# Patient Record
Sex: Male | Born: 2000 | Race: Black or African American | Hispanic: No | Marital: Single | Smoking: Never smoker
Health system: Southern US, Community
[De-identification: ages and names within clinical notes are randomized; demographics above are authoritative.]

---
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
## 2018-03-25 ENCOUNTER — Ambulatory Visit: Payer: Self-pay | Admitting: Nutrition

## 2018-06-27 ENCOUNTER — Ambulatory Visit (INDEPENDENT_AMBULATORY_CARE_PROVIDER_SITE_OTHER): Payer: Medicaid Other | Admitting: Family Medicine

## 2018-06-27 ENCOUNTER — Encounter: Payer: Self-pay | Admitting: Family Medicine

## 2018-06-27 VITALS — BP 127/79 | HR 59 | Temp 97.6°F | Ht 74.0 in | Wt 197.1 lb

## 2018-06-27 DIAGNOSIS — Z0000 Encounter for general adult medical examination without abnormal findings: Secondary | ICD-10-CM

## 2018-06-27 DIAGNOSIS — Z00129 Encounter for routine child health examination without abnormal findings: Secondary | ICD-10-CM

## 2018-06-27 NOTE — Progress Notes (Signed)
Subjective:  Patient ID: Harold Rasmussen, male    DOB: 09/29/2001  Age: 17 y.o. MRN: [id number]  CC: Annual Exam   HPI Harold Rasmussen presents for annual physical. Attends [employer]. Considering [employer] Hasn't decided which branch.  Depression screen [facility] 2/9 06/27/2018 06/04/2017 05/21/2017  Decreased Interest 0 0 0  Down, Depressed, Hopeless 0 0 0  PHQ - 2 Score 0 0 0  Altered sleeping - - -  Tired, decreased energy - - -  Change in appetite - - -  Feeling bad or failure about yourself  - - -  Trouble concentrating - - -  Moving slowly or fidgety/restless - - -  Suicidal thoughts - - -  PHQ-9 Score - - -    History Harold Rasmussen has no past medical history on file.   He has no past surgical history on file.   His family history is not on file.He reports that he has never smoked. He has never used smokeless tobacco. He reports that he does not drink alcohol. His drug history is not on file.    ROS Review of Systems  Constitutional: Negative for activity change, fatigue and unexpected weight change.  HENT: Negative for congestion, ear pain, hearing loss, postnasal drip and trouble swallowing.   Eyes: Negative for pain and visual disturbance.  Respiratory: Negative for cough, chest tightness and shortness of breath.   Cardiovascular: Negative for chest pain, palpitations and leg swelling.  Gastrointestinal: Negative for abdominal distention, abdominal pain, blood in stool, constipation, diarrhea, nausea and vomiting.  Endocrine: Negative for cold intolerance, heat intolerance and polydipsia.  Genitourinary: Negative for difficulty urinating, dysuria, flank pain, frequency and urgency.  Musculoskeletal: Negative for arthralgias and joint swelling.  Skin: Negative for color change, rash and wound.  Neurological: Negative for dizziness, syncope, speech difficulty, weakness, light-headedness, numbness and headaches.  Hematological: Does not bruise/bleed easily.    Psychiatric/Behavioral: Negative for confusion, decreased concentration, dysphoric mood and sleep disturbance. The patient is not nervous/anxious.     Objective:  BP 127/79   Pulse (!) 59   Temp 97.6 F (36.4 C) (Oral)   Ht 6' 2"  (1.88 m)   Wt 197 lb 2 oz (89.4 kg)   BMI 25.31 kg/m   BP Readings from Last 3 Encounters:  06/27/18 127/79  06/04/17 118/76  05/21/17 120/72    Wt Readings from Last 3 Encounters:  06/27/18 197 lb 2 oz (89.4 kg) (92 %, Z= 1.40)*  06/04/17 180 lb 12.8 oz (82 kg) (86 %, Z= 1.10)*  05/21/17 184 lb (83.5 kg) (88 %, Z= 1.19)*   * Growth percentiles are based on CDC (Boys, 2-20 Years) data.     Physical Exam  Constitutional: He is oriented to person, place, and time. He appears well-developed and well-nourished.  HENT:  Head: Normocephalic and atraumatic.  Mouth/Throat: Oropharynx is clear and moist.  Eyes: Pupils are equal, round, and reactive to light. EOM are normal.  Neck: Normal range of motion. No tracheal deviation present. No thyromegaly present.  Cardiovascular: Normal rate, regular rhythm and normal heart sounds. Exam reveals no gallop and no friction rub.  No murmur heard. Pulmonary/Chest: Breath sounds normal. He has no wheezes. He has no rales.  Abdominal: Soft. Bowel sounds are normal. He exhibits no distension and no mass. There is no tenderness. Hernia confirmed negative in the right inguinal area and confirmed negative in the left inguinal area.  Genitourinary: Testes normal and penis normal.  Musculoskeletal: Normal range of motion. He exhibits no  edema.  Lymphadenopathy:    He has no cervical adenopathy.  Neurological: He is alert and oriented to person, place, and time.  Skin: Skin is warm and dry.  Psychiatric: He has a normal mood and affect.      Assessment & Plan:   Harold Rasmussen was seen today for annual exam.  Diagnoses and all orders for this visit:  Routine general medical examination at a health care facility -      CBC with Differential/Platelet -     CMP14+EGFR -     Lipid panel       I have discontinued Harold Rasmussen's methylphenidate. I am also having him maintain his acetaminophen and cetirizine.  Allergies as of 06/27/2018   No Known Allergies     Medication List        Accurate as of 06/27/18  3:01 PM. Always use your most recent med list.          acetaminophen 325 MG tablet Commonly known as:  TYLENOL Take 650 mg by mouth every 6 (six) hours as needed (headache).   cetirizine 10 MG tablet Commonly known as:  ZYRTEC TAKE 1 TABLET EVERY DAY        Follow-up: Return in about 1 year (around 06/28/2019).  Claretta Fraise, M.D.

## 2018-06-28 LAB — CMP14+EGFR
ALT: 14 IU/L (ref 0–44)
AST: 22 IU/L (ref 0–40)
Albumin/Globulin Ratio: 1.6 (ref 1.2–2.2)
Albumin: 4.4 g/dL (ref 3.5–5.5)
Alkaline Phosphatase: 137 IU/L — ABNORMAL HIGH (ref 39–117)
BUN/Creatinine Ratio: 8 — ABNORMAL LOW (ref 9–20)
BUN: 8 mg/dL (ref 6–20)
Bilirubin Total: 0.6 mg/dL (ref 0.0–1.2)
CO2: 26 mmol/L (ref 20–29)
Calcium: 10 mg/dL (ref 8.7–10.2)
Chloride: 99 mmol/L (ref 96–106)
Creatinine, Ser: 1.06 mg/dL (ref 0.76–1.27)
GFR calc Af Amer: 117 mL/min/{1.73_m2} (ref >59)
GFR calc non Af Amer: 101 mL/min/{1.73_m2} (ref >59)
Globulin, Total: 2.8 g/dL (ref 1.5–4.5)
Glucose: 71 mg/dL (ref 65–99)
Potassium: 3.9 mmol/L (ref 3.5–5.2)
Sodium: 140 mmol/L (ref 134–144)
Total Protein: 7.2 g/dL (ref 6.0–8.5)

## 2018-06-28 LAB — CBC WITH DIFFERENTIAL/PLATELET
Basophils Absolute: 0 10*3/uL (ref 0.0–0.2)
Basos: 1 %
EOS (ABSOLUTE): 0.4 10*3/uL (ref 0.0–0.4)
Eos: 8 %
Hematocrit: 40.4 % (ref 37.5–51.0)
Hemoglobin: 14 g/dL (ref 13.0–17.7)
Immature Grans (Abs): 0 10*3/uL (ref 0.0–0.1)
Immature Granulocytes: 0 %
Lymphocytes Absolute: 1.6 10*3/uL (ref 0.7–3.1)
Lymphs: 31 %
MCH: 30.6 pg (ref 26.6–33.0)
MCHC: 34.7 g/dL (ref 31.5–35.7)
MCV: 88 fL (ref 79–97)
Monocytes Absolute: 0.5 10*3/uL (ref 0.1–0.9)
Monocytes: 11 %
Neutrophils Absolute: 2.4 10*3/uL (ref 1.4–7.0)
Neutrophils: 49 %
Platelets: 216 10*3/uL (ref 150–450)
RBC: 4.57 x10E6/uL (ref 4.14–5.80)
RDW: 13.6 % (ref 12.3–15.4)
WBC: 4.9 10*3/uL (ref 3.4–10.8)

## 2018-06-28 LAB — LIPID PANEL
Chol/HDL Ratio: 3.4 ratio (ref 0.0–5.0)
Cholesterol, Total: 175 mg/dL — ABNORMAL HIGH (ref 100–169)
HDL: 51 mg/dL (ref >39)
LDL Calculated: 99 mg/dL (ref 0–109)
Triglycerides: 126 mg/dL — ABNORMAL HIGH (ref 0–89)
VLDL Cholesterol Cal: 25 mg/dL (ref 5–40)

## 2018-08-05 ENCOUNTER — Other Ambulatory Visit: Payer: Self-pay | Admitting: Family Medicine

## 2018-11-07 ENCOUNTER — Encounter: Payer: Self-pay | Admitting: Family Medicine

## 2018-11-07 ENCOUNTER — Ambulatory Visit (INDEPENDENT_AMBULATORY_CARE_PROVIDER_SITE_OTHER): Payer: Medicaid Other | Admitting: Family Medicine

## 2018-11-07 VITALS — BP 133/87 | HR 73 | Temp 97.4°F | Ht 74.0 in | Wt 188.5 lb

## 2018-11-07 DIAGNOSIS — L03012 Cellulitis of left finger: Secondary | ICD-10-CM

## 2018-11-07 MED ORDER — CIPROFLOXACIN HCL 500 MG PO TABS: 500.0000 mg | ORAL_TABLET | Freq: Two times a day (BID) | ORAL | 0 refills | Status: DC

## 2018-11-07 NOTE — Progress Notes (Signed)
Chief Complaint  Patient presents with  . Hand Pain    pt here today c/o left index finger swollen and tender     HPI  Patient presents today for sweling andpain for 2 days. Getting worse. Located at left index finger adjacent to the nail. No fever chills or sweats.  PMH: Smoking status noted ROS: Per HPI  Objective: BP 133/87   Pulse 73   Temp (!) 97.4 F (36.3 C) (Oral)   Ht 6\' 2"  (1.88 m)   Wt 188 lb 8 oz (85.5 kg)   BMI 24.20 kg/m  Gen: NAD, alert, cooperative with exam HEENT: NCAT, EOMI, PERRL  Ext: No edema, warm. FROM left hand and index finger. Lesion is moderately edematous & erythematous with fluctuance. Sterile prep with betadine. Lanced with sterile blade exuding 1 ml serosanguinous matterdressing applied. Neuro: Alert and oriented, No gross deficits  Assessment and plan:  1. Paronychia of finger of left hand     Meds ordered this encounter  Medications  . ciprofloxacin (CIPRO) 500 MG tablet    Sig: Take 1 tablet (500 mg total) by mouth 2 (two) times daily.    Dispense:  20 tablet    Refill:  0    Wound care reviewed.   Follow up as needed.  Mechele Claude, MD

## 2019-09-18 ENCOUNTER — Encounter: Payer: Self-pay | Admitting: *Deleted

## 2019-09-18 ENCOUNTER — Encounter: Payer: Self-pay | Admitting: Family

## 2019-09-18 ENCOUNTER — Other Ambulatory Visit: Payer: Self-pay

## 2019-09-18 ENCOUNTER — Ambulatory Visit (INDEPENDENT_AMBULATORY_CARE_PROVIDER_SITE_OTHER): Payer: PRIVATE HEALTH INSURANCE | Admitting: Family

## 2019-09-18 VITALS — BP 135/78 | HR 57 | Temp 98.6°F | Ht 74.0 in | Wt 184.0 lb

## 2019-09-18 DIAGNOSIS — S61212A Laceration without foreign body of right middle finger without damage to nail, initial encounter: Secondary | ICD-10-CM

## 2019-09-18 DIAGNOSIS — S9031XA Contusion of right foot, initial encounter: Secondary | ICD-10-CM | POA: Diagnosis not present

## 2019-09-18 NOTE — Progress Notes (Signed)
Subjective:    Patient ID: Harold Rasmussen, male    DOB: 14-Jun-2001, 18 y.o.   MRN: [id number]  Chief Complaint  Patient presents with  . pain in heel of right foot  . right, middle finger laceration   PT presents to the office today with a cut of his right middle finger. He states he dropped his phone in a friends couch and cut his finger on a piece of metal while trying to get it.  Laceration  The incident occurred 12 to 24 hours ago. Pain location: right middle finger. Size: 0.8 cm. The laceration mechanism was a metal edge. The pain is at a severity of 4/10. The pain is mild. The pain has been intermittent since onset. He reports no foreign bodies present. His tetanus status is UTD.  Foot Injury  The incident occurred more than 1 week ago. The injury mechanism was a direct blow. Pain location: right heel. The pain is at a severity of 2/10 (when running, 0 while sitting). The pain has been intermittent since onset. Pertinent negatives include no loss of motion. He reports no foreign bodies present. Exacerbated by: running. He has tried acetaminophen for the symptoms. The treatment provided mild relief.      Review of Systems  All other systems reviewed and are negative.      Objective:   Physical Exam Vitals reviewed.  Constitutional:      General: He is not in acute distress.    Appearance: He is well-developed.  HENT:     Head: Normocephalic.     Right Ear: External ear normal.     Left Ear: External ear normal.  Eyes:     General:        Right eye: No discharge.        Left eye: No discharge.     Pupils: Pupils are equal, round, and reactive to light.  Neck:     Thyroid: No thyromegaly.  Cardiovascular:     Rate and Rhythm: Normal rate and regular rhythm.     Heart sounds: Normal heart sounds. No murmur.  Pulmonary:     Effort: Pulmonary effort is normal. No respiratory distress.     Breath sounds: Normal breath sounds. No wheezing.  Abdominal:     General:  Bowel sounds are normal. There is no distension.     Palpations: Abdomen is soft.     Tenderness: There is no abdominal tenderness.  Musculoskeletal:        General: No tenderness. Normal range of motion.     Cervical back: Normal range of motion and neck supple.  Skin:    General: Skin is warm and dry.     Findings: No erythema or rash.     Comments: Small incision 0.8 cm on right middle finger under second joint. Full ROM of hand  Neurological:     Mental Status: He is alert and oriented to person, place, and time.     Cranial Nerves: No cranial nerve deficit.     Deep Tendon Reflexes: Reflexes are normal and symmetric.  Psychiatric:        Behavior: Behavior normal.        Thought Content: Thought content normal.        Judgment: Judgment normal.    Dermabond applied to right middle finger. Bandage applied. No erythemas, swelling, or discharge   BP 135/78   Pulse (!) 57   Temp 98.6 F (37 C) (Temporal)   Ht 6\' 2"  (  1.88 m)   Wt 184 lb (83.5 kg)   SpO2 100%   BMI 23.62 kg/m       Assessment & Plan:  Norton Bivins comes in today with chief complaint of pain in heel of right foot and right, middle finger laceration   Diagnosis and orders addressed:  1. Laceration of right middle finger without foreign body without damage to nail, initial encounter Keep area clean, avoid getting wet for next 2 days TDAP UTD Report any s/s of infection   2. Contusion of right heel, initial encounter Rest Ice  ROM exercises encouraged      Evelina Dun, FNP

## 2019-09-18 NOTE — Patient Instructions (Signed)
Laceration Care, Adult  A laceration is a cut that may go through all layers of the skin and into the tissue that is right under the skin. Some lacerations heal on their own. Others need to be closed with stitches (sutures), staples, skin adhesive strips, or skin glue. Proper care of a laceration reduces the risk for infection, helps the laceration heal better, and may prevent scarring.  How to care for your laceration  Wash your hands with soap and water before touching your wound or changing your bandage (dressing). If soap and water are not available, use hand sanitizer.  Keep the wound clean and dry.  If you were given a dressing, you should change it at least once a day, or as told by your health care provider. You should also change it if it becomes wet or dirty.  If sutures or staples were used:  · Keep the wound completely dry for the first 24 hours, or as told by your health care provider. After that time, you may shower or bathe. However, make sure that the wound is not soaked in water until after the sutures or staples have been removed.  · Clean the wound once each day, or as told by your health care provider:  ? Wash the wound with soap and water.  ? Rinse the wound with water to remove all soap.  ? Pat the wound dry with a clean towel. Do not rub the wound.  · After cleaning the wound, apply a thin layer of antibiotic ointment as told by your health care provider. This will help prevent infection and keep the dressing from sticking to the wound.  · Have the sutures or staples removed as told by your health care provider.  If skin adhesive strips were used:  · Do not get the skin adhesive strips wet. You may shower or bathe, but be careful to keep the wound dry.  · If the wound gets wet, pat it dry with a clean towel. Do not rub the wound.  · Skin adhesive strips fall off on their own. You may trim the strips as the wound heals. Do not remove skin adhesive strips that are still stuck to the wound. They  will fall off in time.  If skin glue was used:  · Try to keep the wound dry, but you may briefly wet it in the shower or bath. Do not soak the wound in water, such as by swimming.  · After you have showered or bathed, gently pat the wound dry with a clean towel. Do not rub the wound.  · Do not do any activities that will make you sweat heavily until the skin glue has fallen off on its own.  · Do not apply liquid, cream, or ointment medicine to the wound while the skin glue is in place. Using those may loosen the film before the wound has healed.  · If a dressing is placed over the wound, be careful not to apply tape directly over the skin glue. Doing that may cause the glue to be pulled off before the wound has healed.  · Do not pick at the glue. Skin glue usually remains in place for 5-10 days and then falls off the skin.  General instructions    · Take over-the-counter and prescription medicines only as told by your health care provider.  · If you were prescribed an antibiotic medicine or ointment, take or apply it as told by your health care provider.   infection. Watch for: ? Redness, swelling, or pain. ? Fluid, blood, or pus.  Raise (elevate) the injured area above the level of your heart while you are sitting or lying down for the first 24-48 hours after the laceration is repaired.  If directed, put ice on the affected area: ? Put ice in a plastic bag. ? Place a towel between your skin and the bag. ? Leave the ice on for 20 minutes, 2-3 times a day.  Keep all follow-up visits as told by your health care provider. This is important. Contact a health care provider if:  You received a tetanus shot and you have swelling, severe pain, redness, or bleeding at the injection site.  You have a fever.  A wound that was closed breaks open.  You notice a bad smell  coming from your wound or your dressing.  You notice something coming out of the wound, such as wood or glass.  Your pain is not controlled with medicine.  You have increased redness, swelling, or pain at the site of your wound.  You have fluid, blood, or pus coming from your wound.  You need to change the dressing often due to fluid, blood, or pus that is draining from the wound.  You develop a new rash.  You develop numbness around the wound. Get help right away if:  You develop severe swelling around the wound.  Your pain suddenly increases and is severe.  You develop painful lumps near the wound or on skin anywhere else on your body.  You have a red streak going away from your wound.  The wound is on your hand or foot and you cannot properly move a finger or toe.  The wound is on your hand or foot, and you notice that your fingers or toes look pale or bluish. Summary  A laceration is a cut that may go through all layers of the skin and into the tissue that is right under the skin.  Some lacerations heal on their own. Others need to be closed with stitches (sutures), staples, skin adhesive strips, or skin glue.  Proper care of a laceration reduces the risk of infection, helps the laceration heal better, and prevents scarring. This information is not intended to replace advice given to you by your health care provider. Make sure you discuss any questions you have with your health care provider. Document Released: 09/24/2005 Document Revised: 11/22/2017 Document Reviewed: 10/14/2017 Elsevier Patient Education  2020 [employer] Contusion A contusion is a deep bruise. This is a result of an injury that causes bleeding under the skin. Symptoms of bruising include pain, swelling, and discolored skin. The skin may turn blue, purple, or yellow. Follow these instructions at home: Managing pain, stiffness, and swelling You may use RICE. This stands  for:  Resting.  Icing.  Compression, or putting pressure.  Elevating, or raising the injured area. To follow this method, do these actions:  Rest the injured area.  If told, put ice on the injured area. ? Put ice in a plastic bag. ? Place a towel between your skin and the bag. ? Leave the ice on for 20 minutes, 2-3 times per day.  If told, put light pressure (compression) on the injured area using an elastic bandage. Make sure the bandage is not too tight. If the area tingles or becomes numb, remove it and put it back on as told by your doctor.  If possible, raise (elevate) the injured area above the level  of your heart while you are sitting or lying down.  General instructions  Take over-the-counter and prescription medicines only as told by your doctor.  Keep all follow-up visits as told by your doctor. This is important. Contact a doctor if:  Your symptoms do not get better after several days of treatment.  Your symptoms get worse.  You have trouble moving the injured area. Get help right away if:  You have very bad pain.  You have a loss of feeling (numbness) in a hand or foot.  Your hand or foot turns pale or cold. Summary  A contusion is a deep bruise. This is a result of an injury that causes bleeding under the skin.  Symptoms of bruising include pain, swelling, and discolored skin. The skin may turn blue, purple, or yellow.  This condition is treated with rest, ice, compression, and elevation. This is also called RICE. You may be given over-the-counter medicines for pain.  Contact a doctor if you do not feel better, or you feel worse. Get help right away if you have very bad pain, have lost feeling in a hand or foot, or the area turns pale or cold. This information is not intended to replace advice given to you by your health care provider. Make sure you discuss any questions you have with your health care provider. Document Released: 03/12/2008 Document  Revised: 05/16/2018 Document Reviewed: 05/16/2018 Elsevier Patient Education  2020 [employer].

## 2019-11-30 ENCOUNTER — Telehealth: Payer: Self-pay | Admitting: Family Medicine

## 2019-11-30 ENCOUNTER — Encounter: Payer: Self-pay | Admitting: Family Medicine

## 2019-11-30 ENCOUNTER — Ambulatory Visit (INDEPENDENT_AMBULATORY_CARE_PROVIDER_SITE_OTHER): Payer: 59

## 2019-11-30 ENCOUNTER — Ambulatory Visit (INDEPENDENT_AMBULATORY_CARE_PROVIDER_SITE_OTHER): Payer: 59 | Admitting: Family Medicine

## 2019-11-30 ENCOUNTER — Other Ambulatory Visit: Payer: Self-pay

## 2019-11-30 VITALS — BP 137/89 | HR 63 | Temp 99.5°F | Ht 74.0 in | Wt 185.8 lb

## 2019-11-30 DIAGNOSIS — S4991XA Unspecified injury of right shoulder and upper arm, initial encounter: Secondary | ICD-10-CM | POA: Diagnosis not present

## 2019-11-30 DIAGNOSIS — Z202 Contact with and (suspected) exposure to infections with a predominantly sexual mode of transmission: Secondary | ICD-10-CM | POA: Diagnosis not present

## 2019-11-30 DIAGNOSIS — S43421A Sprain of right rotator cuff capsule, initial encounter: Secondary | ICD-10-CM | POA: Diagnosis not present

## 2019-11-30 MED ORDER — DICLOFENAC SODIUM 75 MG PO TBEC: 75.0000 mg | DELAYED_RELEASE_TABLET | Freq: Two times a day (BID) | ORAL | 1 refills | Status: DC

## 2019-11-30 MED ORDER — CEFTRIAXONE SODIUM 500 MG IJ SOLR
500.0000 mg | Freq: Once | INTRAMUSCULAR | Status: AC
  Administered 2019-11-30: 15:00:00 500 mg via INTRAMUSCULAR

## 2019-11-30 MED ORDER — DOXYCYCLINE HYCLATE 100 MG PO CAPS: 100.0000 mg | ORAL_CAPSULE | Freq: Two times a day (BID) | ORAL | 0 refills | Status: DC

## 2019-11-30 NOTE — Telephone Encounter (Signed)
Patient was seen today by Dr. Darlyn Read- please advise.

## 2019-11-30 NOTE — Progress Notes (Signed)
Subjective:  Patient ID: Harold Rasmussen, male    DOB: 01/22/2001  Age: 19 y.o. MRN: [id number]  CC: Shoulder Pain (Right, Injury 11/28/19)   HPI Royston Burdi presents for pain at right shoulder after boxing injury 2 days ago. Threw a punch and missed, causing his right shoulder to rotate inward and extend forcefully.  Denies STD sx. Girlfriend tested positive for chlamydia. They last had intercourse 3 weeks ago.   Depression screen [facility] 2/9 11/30/2019 09/18/2019 06/27/2018  Decreased Interest 0 0 0  Down, Depressed, Hopeless 0 0 0  PHQ - 2 Score 0 0 0  Altered sleeping - - -  Tired, decreased energy - - -  Change in appetite - - -  Feeling bad or failure about yourself  - - -  Trouble concentrating - - -  Moving slowly or fidgety/restless - - -  Suicidal thoughts - - -  PHQ-9 Score - - -    History [place] has no past medical history on file.   He has no past surgical history on file.   His family history is not on file.He reports that he has never smoked. He has never used smokeless tobacco. He reports that he does not drink alcohol or use drugs.    ROS Review of Systems Noncontributory   Objective:  BP 137/89   Pulse 63   Temp 99.5 F (37.5 C) (Temporal)   Ht 6\' 2"  (1.88 m)   Wt 185 lb 12.8 oz (84.3 kg)   BMI 23.86 kg/m   BP Readings from Last 3 Encounters:  11/30/19 137/89  09/18/19 135/78  11/07/18 133/87    Wt Readings from Last 3 Encounters:  11/30/19 185 lb 12.8 oz (84.3 kg)  09/18/19 184 lb (83.5 kg)  11/07/18 188 lb 8 oz (85.5 kg) (87 %, Z= 1.14)*   * Growth percentiles are based on CDC (Boys, 2-20 Years) data.     Physical Exam Vitals reviewed.  Constitutional:      Appearance: He is well-developed.  HENT:     Head: Normocephalic and atraumatic.     Right Ear: External ear normal.     Left Ear: External ear normal.     Mouth/Throat:     Pharynx: No oropharyngeal exudate or posterior oropharyngeal erythema.  Eyes:   Pupils: Pupils are equal, round, and reactive to light.  Cardiovascular:     Rate and Rhythm: Normal rate and regular rhythm.     Heart sounds: No murmur.  Pulmonary:     Effort: No respiratory distress.     Breath sounds: Normal breath sounds.  Musculoskeletal:        General: Tenderness (right shoulder over deltoid) present.     Cervical back: Normal range of motion and neck supple.  Neurological:     Mental Status: He is alert and oriented to person, place, and time.       Assessment & Plan:   Jathniel was seen today for shoulder pain.  Diagnoses and all orders for this visit:  Injury of right shoulder, initial encounter -     DG Shoulder Right; Future  Sprain of right rotator cuff capsule, initial encounter -     diclofenac (VOLTAREN) 75 MG EC tablet; Take 1 tablet (75 mg total) by mouth 2 (two) times daily. For muscle and  Joint pain  Chlamydia contact -     C. trachomatis/N. gonorrhoeae RNA -     HIV Antibody (routine testing w rflx) -  RPR -     doxycycline (VIBRAMYCIN) 100 MG capsule; Take 1 capsule (100 mg total) by mouth 2 (two) times daily. -     cefTRIAXone (ROCEPHIN) injection 500 mg       I have discontinued Keelan Capistran's cetirizine. I am also having him start on doxycycline and diclofenac. Additionally, I am having him maintain his acetaminophen. We administered cefTRIAXone.  Allergies as of 11/30/2019   No Known Allergies     Medication List       Accurate as of November 30, 2019 10:31 PM. If you have any questions, ask your nurse or doctor.        STOP taking these medications   cetirizine 10 MG tablet Commonly known as: ZYRTEC Stopped by: Mechele Claude, MD     TAKE these medications   acetaminophen 325 MG tablet Commonly known as: TYLENOL Take 650 mg by mouth every 6 (six) hours as needed (headache).   diclofenac 75 MG EC tablet Commonly known as: VOLTAREN Take 1 tablet (75 mg total) by mouth 2 (two) times daily. For  muscle and  Joint pain Started by: Mechele Claude, MD   doxycycline 100 MG capsule Commonly known as: Vibramycin Take 1 capsule (100 mg total) by mouth 2 (two) times daily. Started by: Mechele Claude, MD        Follow-up: Return if symptoms worsen or fail to improve.  Mechele Claude, M.D.

## 2019-11-30 NOTE — Telephone Encounter (Signed)
Done, thank you Harold Rasmussen! WS

## 2019-12-01 LAB — HIV ANTIBODY (ROUTINE TESTING W REFLEX): HIV Screen 4th Generation wRfx: NONREACTIVE

## 2019-12-01 LAB — RPR: RPR Ser Ql: NONREACTIVE

## 2019-12-08 ENCOUNTER — Other Ambulatory Visit: Payer: 59

## 2019-12-08 ENCOUNTER — Other Ambulatory Visit: Payer: Self-pay | Admitting: Family Medicine

## 2019-12-08 ENCOUNTER — Other Ambulatory Visit: Payer: Self-pay | Admitting: *Deleted

## 2019-12-08 DIAGNOSIS — Z202 Contact with and (suspected) exposure to infections with a predominantly sexual mode of transmission: Secondary | ICD-10-CM

## 2019-12-08 DIAGNOSIS — Z7251 High risk heterosexual behavior: Secondary | ICD-10-CM

## 2019-12-10 LAB — CHLAMYDIA/GONOCOCCUS/TRICHOMONAS, NAA
Chlamydia by NAA: NEGATIVE
Gonococcus by NAA: NEGATIVE
Trich vag by NAA: NEGATIVE

## 2020-01-28 ENCOUNTER — Other Ambulatory Visit: Payer: Self-pay | Admitting: Family Medicine

## 2020-04-22 ENCOUNTER — Other Ambulatory Visit: Payer: Self-pay | Admitting: Family Medicine

## 2020-07-26 ENCOUNTER — Other Ambulatory Visit: Payer: Self-pay | Admitting: Family Medicine

## 2020-08-05 ENCOUNTER — Ambulatory Visit (INDEPENDENT_AMBULATORY_CARE_PROVIDER_SITE_OTHER): Payer: 59 | Admitting: Nurse Practitioner

## 2020-08-05 ENCOUNTER — Other Ambulatory Visit: Payer: Self-pay

## 2020-08-05 ENCOUNTER — Encounter: Payer: Self-pay | Admitting: Nurse Practitioner

## 2020-08-05 VITALS — BP 126/80 | HR 69 | Temp 97.8°F | Resp 20 | Ht 74.0 in | Wt 194.2 lb

## 2020-08-05 DIAGNOSIS — H6123 Impacted cerumen, bilateral: Secondary | ICD-10-CM | POA: Diagnosis not present

## 2020-08-05 NOTE — Assessment & Plan Note (Signed)
Bilateral impacted cerumen not well managed.  Ear flushed and patent.  Provided education to patient with printed handouts given advised patient to to use Debrox 1-2 times a week to soften earwax.  Avoid Q-tips in ear canal.  Follow-up with unresolved or worsening symptoms.

## 2020-08-05 NOTE — Progress Notes (Signed)
Acute Office Visit  Subjective:    Patient ID: Harold Rasmussen, male    DOB: 2001-07-31, 19 y.o.   MRN: [id number]  Chief Complaint  Patient presents with  . Ears stopped up    HPI Patient is in today for cerumen impaction bilateral ear.  This is  new for patient in the last few weeks.  Denies fever, pain dizziness and headache. .  Patient has tried to remove wax at home with no success.    History reviewed. No pertinent family history.  Social History   Socioeconomic History  . Marital status: Single    Spouse name: Not on file  . Number of children: Not on file  . Years of education: Not on file  . Highest education level: Not on file  Occupational History  . Not on file  Tobacco Use  . Smoking status: Never Smoker  . Smokeless tobacco: Never Used  Vaping Use  . Vaping Use: Never used  Substance and Sexual Activity  . Alcohol use: No  . Drug use: Never  . Sexual activity: Yes    Birth control/protection: Condom  Other Topics Concern  . Not on file  Social History Narrative  . Not on file   Social Determinants of Health   Financial Resource Strain:   . Difficulty of Paying Living Expenses: Not on file  Food Insecurity:   . Worried About Programme researcher, broadcasting/film/video in the Last Year: Not on file  . Ran Out of Food in the Last Year: Not on file  Transportation Needs:   . Lack of Transportation (Medical): Not on file  . Lack of Transportation (Non-Medical): Not on file  Physical Activity:   . Days of Exercise per Week: Not on file  . Minutes of Exercise per Session: Not on file  Stress:   . Feeling of Stress : Not on file  Social Connections:   . Frequency of Communication with Friends and Family: Not on file  . Frequency of Social Gatherings with Friends and Family: Not on file  . Attends Religious Services: Not on file  . Active Member of Clubs or Organizations: Not on file  . Attends Banker Meetings: Not on file  . Marital Status: Not on file    Intimate Partner Violence:   . Fear of Current or Ex-Partner: Not on file  . Emotionally Abused: Not on file  . Physically Abused: Not on file  . Sexually Abused: Not on file    Outpatient Medications Prior to Visit  Medication Sig Dispense Refill  . [facility] INDOOR[facility]OUTDOOR ALLERGY RLF 10 MG tablet TAKE 1 TABLET BY MOUTH EVERY DAY 90 tablet 0  . acetaminophen (TYLENOL) 325 MG tablet Take 650 mg by mouth every 6 (six) hours as needed (headache). (Patient not taking: Reported on 08/05/2020)    . diclofenac (VOLTAREN) 75 MG EC tablet Take 1 tablet (75 mg total) by mouth 2 (two) times daily. For muscle and  Joint pain (Patient not taking: Reported on 08/05/2020) 60 tablet 1  . doxycycline (VIBRAMYCIN) 100 MG capsule Take 1 capsule (100 mg total) by mouth 2 (two) times daily. 20 capsule 0   No facility-administered medications prior to visit.    No Known Allergies  Review of Systems  HENT: Negative for ear discharge, ear pain, sinus pressure, sinus pain and tinnitus.        Cerumen impaction, mild hearing loss due to wax.  All other systems reviewed and are negative.  Objective:    Physical Exam Vitals reviewed.  Constitutional:      Appearance: Normal appearance.  HENT:     Head: Normocephalic.     Right Ear: There is impacted cerumen.     Left Ear: There is impacted cerumen.  Eyes:     Conjunctiva/sclera: Conjunctivae normal.  Cardiovascular:     Rate and Rhythm: Normal rate and regular rhythm.     Pulses: Normal pulses.     Heart sounds: Normal heart sounds.  Pulmonary:     Effort: Pulmonary effort is normal.     Breath sounds: Normal breath sounds.  Abdominal:     General: Bowel sounds are normal.  Skin:    General: Skin is warm.  Neurological:     Mental Status: He is alert and oriented to person, place, and time.  Psychiatric:        Mood and Affect: Mood normal.        Behavior: Behavior normal.     BP 126/80   Pulse 69   Temp 97.8 F (36.6 C) (Temporal)    Resp 20   Ht 6\' 2"  (1.88 m)   Wt 194 lb 4 oz (88.1 kg)   SpO2 100%   BMI 24.94 kg/m  Wt Readings from Last 3 Encounters:  08/05/20 194 lb 4 oz (88.1 kg)  11/30/19 185 lb 12.8 oz (84.3 kg)  09/18/19 184 lb (83.5 kg)    Health Maintenance Due  Topic Date Due  . Hepatitis C Screening  Never done    There are no preventive care reminders to display for this patient.   No results found for: TSH Lab Results  Component Value Date   WBC 4.9 06/27/2018   HGB 14.0 06/27/2018   HCT 40.4 06/27/2018   MCV 88 06/27/2018   PLT 216 06/27/2018   Lab Results  Component Value Date   NA 140 06/27/2018   K 3.9 06/27/2018   CO2 26 06/27/2018   GLUCOSE 71 06/27/2018   BUN 8 06/27/2018   CREATININE 1.06 06/27/2018   BILITOT 0.6 06/27/2018   ALKPHOS 137 (H) 06/27/2018   AST 22 06/27/2018   ALT 14 06/27/2018   PROT 7.2 06/27/2018   ALBUMIN 4.4 06/27/2018   CALCIUM 10.0 06/27/2018   Lab Results  Component Value Date   CHOL 175 (H) 06/27/2018   Lab Results  Component Value Date   HDL 51 06/27/2018   Lab Results  Component Value Date   LDLCALC 99 06/27/2018   Lab Results  Component Value Date   TRIG 126 (H) 06/27/2018   Lab Results  Component Value Date   CHOLHDL 3.4 06/27/2018   No results found for: HGBA1C     Assessment & Plan:   Problem List Items Addressed This Visit      Nervous and Auditory   Bilateral impacted cerumen - Primary    Bilateral impacted cerumen not well managed.  Ear flushed and patent.  Provided education to patient with printed handouts given advised patient to to use Debrox 1-2 times a week to soften earwax.  Avoid Q-tips in ear canal.  Follow-up with unresolved or worsening symptoms.            06/29/2018, NP

## 2020-08-05 NOTE — Patient Instructions (Addendum)
Advised patient to avoid Q-tip in the ear canal, use Debrox over-the-counter 1-2 times a week.  Increase hydration, follow-up with unresolved or worsening symptoms.  Earwax Buildup, Adult The ears produce a substance called earwax that helps keep bacteria out of the ear and protects the skin in the ear canal. Occasionally, earwax can build up in the ear and cause discomfort or hearing loss. What increases the risk? This condition is more likely to develop in people who:  Are male.  Are elderly.  Naturally produce more earwax.  Clean their ears often with cotton swabs.  Use earplugs often.  Use in-ear headphones often.  Wear hearing aids.  Have narrow ear canals.  Have earwax that is overly thick or sticky.  Have eczema.  Are dehydrated.  Have excess hair in the ear canal. What are the signs or symptoms? Symptoms of this condition include:  Reduced or muffled hearing.  A feeling of fullness in the ear or feeling that the ear is plugged.  Fluid coming from the ear.  Ear pain.  Ear itch.  Ringing in the ear.  Coughing.  An obvious piece of earwax that can be seen inside the ear canal. How is this diagnosed? This condition may be diagnosed based on:  Your symptoms.  Your medical history.  An ear exam. During the exam, your health care provider will look into your ear with an instrument called an otoscope. You may have tests, including a hearing test. How is this treated? This condition may be treated by:  Using ear drops to soften the earwax.  Having the earwax removed by a health care provider. The health care provider may: ? Flush the ear with water. ? Use an instrument that has a loop on the end (curette). ? Use a suction device.  Surgery to remove the wax buildup. This may be done in severe cases. Follow these instructions at home:   Take over-the-counter and prescription medicines only as told by your health care provider.  Do not put any  objects, including cotton swabs, into your ear. You can clean the opening of your ear canal with a washcloth or facial tissue.  Follow instructions from your health care provider about cleaning your ears. Do not over-clean your ears.  Drink enough fluid to keep your urine clear or pale yellow. This will help to thin the earwax.  Keep all follow-up visits as told by your health care provider. If earwax builds up in your ears often or if you use hearing aids, consider seeing your health care provider for routine, preventive ear cleanings. Ask your health care provider how often you should schedule your cleanings.  If you have hearing aids, clean them according to instructions from the manufacturer and your health care provider. Contact a health care provider if:  You have ear pain.  You develop a fever.  You have blood, pus, or other fluid coming from your ear.  You have hearing loss.  You have ringing in your ears that does not go away.  Your symptoms do not improve with treatment.  You feel like the room is spinning (vertigo). Summary  Earwax can build up in the ear and cause discomfort or hearing loss.  The most common symptoms of this condition include reduced or muffled hearing and a feeling of fullness in the ear or feeling that the ear is plugged.  This condition may be diagnosed based on your symptoms, your medical history, and an ear exam.  This condition may  be treated by using ear drops to soften the earwax or by having the earwax removed by a health care provider.  Do not put any objects, including cotton swabs, into your ear. You can clean the opening of your ear canal with a washcloth or facial tissue. This information is not intended to replace advice given to you by your health care provider. Make sure you discuss any questions you have with your health care provider. Document Revised: 09/06/2017 Document Reviewed: 12/05/2016 Elsevier Patient Education  2020  [employer].

## 2020-10-18 ENCOUNTER — Encounter: Payer: Self-pay | Admitting: Nurse Practitioner

## 2020-10-18 ENCOUNTER — Other Ambulatory Visit: Payer: Self-pay

## 2020-10-18 ENCOUNTER — Ambulatory Visit: Payer: 59 | Admitting: Nurse Practitioner

## 2020-10-18 ENCOUNTER — Ambulatory Visit (INDEPENDENT_AMBULATORY_CARE_PROVIDER_SITE_OTHER): Payer: 59 | Admitting: Nurse Practitioner

## 2020-10-18 VITALS — BP 135/77 | HR 62 | Temp 98.0°F | Ht 74.0 in | Wt 190.8 lb

## 2020-10-18 DIAGNOSIS — L2089 Other atopic dermatitis: Secondary | ICD-10-CM | POA: Diagnosis not present

## 2020-10-18 MED ORDER — HYDROCORTISONE 1 % EX LOTN: 1.0000 "application " | TOPICAL_LOTION | Freq: Two times a day (BID) | CUTANEOUS | 2 refills | Status: DC

## 2020-10-18 NOTE — Patient Instructions (Signed)
Follow up in 4-6 weeks  Atopic Dermatitis Atopic dermatitis is a skin disorder that causes inflammation of the skin. It is marked by a red rash and itchy, dry, scaly skin. It is the most common type of eczema. Eczema is a group of skin conditions that cause the skin to become rough and swollen. This condition is generally worse during the cooler winter months and often improves during the warm summer months. Atopic dermatitis usually starts showing signs in infancy and can last through adulthood. This condition cannot be passed from one person to another (is not contagious). Atopic dermatitis may not always be present, but when it is, it is called a flare-up. What are the causes? The exact cause of this condition is not known. Flare-ups may be triggered by:  Coming in contact with something that you are sensitive or allergic to (allergen).  Stress.  Certain foods.  Extremely hot or cold weather.  Harsh chemicals and soaps.  Dry air.  Chlorine. What increases the risk? This condition is more likely to develop in people who have a personal or family history of:  Eczema.  Allergies.  Asthma.  Hay fever. What are the signs or symptoms? Symptoms of this condition include:  Dry, scaly skin.  Red, itchy rash.  Itchiness, which can be severe. This may occur before the skin rash. This can make sleeping difficult.  Skin thickening and cracking that can occur over time.   How is this diagnosed? This condition is diagnosed based on:  Your symptoms.  Your medical history.  A physical exam. How is this treated? There is no cure for this condition, but symptoms can usually be controlled. Treatment focuses on:  Controlling the itchiness and scratching. You may be given medicines, such as antihistamines or steroid creams.  Limiting exposure to allergens.  Recognizing situations that cause stress and developing a plan to manage stress. If your atopic dermatitis does not get  better with medicines, or if it is all over your body (widespread), a treatment using a specific type of light (phototherapy) may be used. Follow these instructions at home: Skin care  Keep your skin well moisturized. Doing this seals in moisture and helps to prevent dryness. ? Use unscented lotions that have petroleum in them. ? Avoid lotions that contain alcohol or water. They can dry the skin.  Keep baths or showers short (less than 5 minutes) in warm water. Do not use hot water. ? Use mild, unscented cleansers for bathing. Avoid soap and bubble bath. ? Apply a moisturizer to your skin right after a bath or shower.  Do not apply anything to your skin without checking with your health care provider.   General instructions  Take or apply over-the-counter and prescription medicines only as told by your health care provider.  Dress in clothes made of cotton or cotton blends. Dress lightly because heat increases itchiness.  When washing your clothes, rinse your clothes twice so all of the soap is removed.  Avoid any triggers that can cause a flare-up.  Keep your fingernails cut short.  Avoid scratching. Scratching makes the rash and itchiness worse. A break in the skin from scratching could result in a skin infection (impetigo).  Do not be around people who have cold sores or fever blisters. If you get the infection, it may cause your atopic dermatitis to worsen.  Keep all follow-up visits. This is important. Contact a health care provider if:  Your itchiness interferes with sleep.  Your rash gets  worse or is not better within one week of starting treatment.  You have a fever.  You have a rash flare-up after having contact with someone who has cold sores or fever blisters. Get help right away if:  You develop pus or soft yellow scabs in the rash area. Summary  Atopic dermatitis causes a red rash and itchy, dry, scaly skin.  Treatment focuses on controlling the itchiness  and scratching, limiting exposure to things that you are sensitive or allergic to (allergens), recognizing situations that cause stress, and developing a plan to manage stress.  Keep your skin well moisturized.  Keep baths or showers shorter than 5 minutes and use warm water. Do not use hot water. This information is not intended to replace advice given to you by your health care provider. Make sure you discuss any questions you have with your health care provider. Document Revised: 07/04/2020 Document Reviewed: 07/04/2020 Elsevier Patient Education  2021 [employer].

## 2020-10-18 NOTE — Progress Notes (Signed)
Acute Office Visit  Subjective:    Patient ID: Harold Rasmussen, male    DOB: [birth year], 20 y.o.   MRN: [id number]  Chief Complaint  Patient presents with  . Hair/Scalp Problem  . Rash    Upper back    Diaper Rash This is a chronic problem. The current episode started more than 1 year ago. The problem occurs intermittently. The problem has been unchanged. Associated symptoms include headaches and a rash. Pertinent negatives include no congestion, coughing, fever or nausea. Nothing aggravates the symptoms. He has tried nothing for the symptoms.  Rash present on patient's upper and left lower lateral  back  Social History   Socioeconomic History  . Marital status: Single    Spouse name: Not on file  . Number of children: Not on file  . Years of education: Not on file  . Highest education level: Not on file  Occupational History  . Not on file  Tobacco Use  . Smoking status: Never Smoker  . Smokeless tobacco: Never Used  Vaping Use  . Vaping Use: Never used  Substance and Sexual Activity  . Alcohol use: No  . Drug use: Never  . Sexual activity: Yes    Birth control/protection: Condom  Other Topics Concern  . Not on file  Social History Narrative  . Not on file   Social Determinants of Health   Financial Resource Strain: Not on file  Food Insecurity: Not on file  Transportation Needs: Not on file  Physical Activity: Not on file  Stress: Not on file  Social Connections: Not on file  Intimate Partner Violence: Not on file    Outpatient Medications Prior to Visit  Medication Sig Dispense Refill  . [facility] INDOOR/OUTDOOR ALLERGY RLF 10 MG tablet TAKE 1 TABLET BY MOUTH EVERY DAY 90 tablet 0  . acetaminophen (TYLENOL) 325 MG tablet Take 650 mg by mouth every 6 (six) hours as needed (headache). (Patient not taking: Reported on 10/18/2020)    . diclofenac (VOLTAREN) 75 MG EC tablet Take 1 tablet (75 mg total) by mouth 2 (two) times daily. For muscle and  Joint pain  (Patient not taking: Reported on 10/18/2020) 60 tablet 1   No facility-administered medications prior to visit.    No Known Allergies  Review of Systems  Constitutional: Negative for fever.  HENT: Negative for congestion.   Respiratory: Negative for cough.   Gastrointestinal: Negative for nausea.  Skin: Positive for color change and rash.  Neurological: Positive for headaches.       Objective:    Physical Exam Vitals reviewed.  Constitutional:      Appearance: Normal appearance.  HENT:     Head: Normocephalic.     Nose: Nose normal.  Cardiovascular:     Rate and Rhythm: Normal rate and regular rhythm.     Pulses: Normal pulses.     Heart sounds: Normal heart sounds.  Pulmonary:     Effort: Pulmonary effort is normal.     Breath sounds: Normal breath sounds.  Abdominal:     General: Bowel sounds are normal.  Musculoskeletal:        General: Normal range of motion.  Skin:    General: Skin is dry.     Findings: Rash present.       Neurological:     Mental Status: He is alert and oriented to person, place, and time.  Psychiatric:        Mood and Affect: Mood normal.  Behavior: Behavior normal.     BP 135/77   Pulse 62   Temp 98 F (36.7 C)   Ht 6\' 2"  (1.88 m)   Wt 190 lb 12.8 oz (86.5 kg)   SpO2 99%   BMI 24.50 kg/m  Wt Readings from Last 3 Encounters:  10/18/20 190 lb 12.8 oz (86.5 kg)  08/05/20 194 lb 4 oz (88.1 kg)  11/30/19 185 lb 12.8 oz (84.3 kg)    Health Maintenance Due  Topic Date Due  . Hepatitis C Screening  Never done    There are no preventive care reminders to display for this patient.   No results found for: TSH Lab Results  Component Value Date   WBC 4.9 06/27/2018   HGB 14.0 06/27/2018   HCT 40.4 06/27/2018   MCV 88 06/27/2018   PLT 216 06/27/2018   Lab Results  Component Value Date   NA 140 06/27/2018   K 3.9 06/27/2018   CO2 26 06/27/2018   GLUCOSE 71 06/27/2018   BUN 8 06/27/2018   CREATININE 1.06 06/27/2018    BILITOT 0.6 06/27/2018   ALKPHOS 137 (H) 06/27/2018   AST 22 06/27/2018   ALT 14 06/27/2018   PROT 7.2 06/27/2018   ALBUMIN 4.4 06/27/2018   CALCIUM 10.0 06/27/2018   Lab Results  Component Value Date   CHOL 175 (H) 06/27/2018   Lab Results  Component Value Date   HDL 51 06/27/2018   Lab Results  Component Value Date   LDLCALC 99 06/27/2018   Lab Results  Component Value Date   TRIG 126 (H) 06/27/2018   Lab Results  Component Value Date   CHOLHDL 3.4 06/27/2018   No results found for: HGBA1C     Assessment & Plan:   Problem List Items Addressed This Visit      Musculoskeletal and Integument   Other atopic dermatitis - Primary    Atopic dermatitis not well controlled.  This is not new for patient.  symptoms have been present for a few years.  Patient reports seeing a dermatologist in the past with no therapeutic effect to treatments.  Patient is not quite sure what exacerbates skin eruptions..  Education provided to patient with printed handouts given.   Started patient on 1% hydrocortisone cream.  Follow-up with PCP in 4 weeks or with worsening/unresolved symptoms.      Relevant Medications   hydrocortisone 1 % lotion       Meds ordered this encounter  Medications  . hydrocortisone 1 % lotion    Sig: Apply 1 application topically 2 (two) times daily.    Dispense:  118 mL    Refill:  2    Order Specific Question:   Supervising Provider    Answer:   06/29/2018 Raliegh Ip     [[id number]], NP

## 2020-10-18 NOTE — Assessment & Plan Note (Signed)
Atopic dermatitis not well controlled.  This is not new for patient.  symptoms have been present for a few years.  Patient reports seeing a dermatologist in the past with no therapeutic effect to treatments.  Patient is not quite sure what exacerbates skin eruptions..  Education provided to patient with printed handouts given.   Started patient on 1% hydrocortisone cream.  Follow-up with PCP in 4 weeks or with worsening/unresolved symptoms.

## 2020-10-30 ENCOUNTER — Other Ambulatory Visit: Payer: Self-pay | Admitting: Family Medicine

## 2021-01-21 ENCOUNTER — Encounter (HOSPITAL_COMMUNITY): Payer: Self-pay | Admitting: Emergency Medicine

## 2021-01-21 ENCOUNTER — Emergency Department (HOSPITAL_COMMUNITY): Payer: 59

## 2021-01-21 ENCOUNTER — Emergency Department (HOSPITAL_COMMUNITY)
Admission: EM | Admit: 2021-01-21 | Discharge: 2021-01-21 | Disposition: A | Discharge status: Home / Self Care | Payer: 59 | Attending: Emergency Medicine | Admitting: Emergency Medicine

## 2021-01-21 ENCOUNTER — Other Ambulatory Visit: Payer: Self-pay

## 2021-01-21 DIAGNOSIS — W108XXA Fall (on) (from) other stairs and steps, initial encounter: Secondary | ICD-10-CM | POA: Insufficient documentation

## 2021-01-21 DIAGNOSIS — W19XXXA Unspecified fall, initial encounter: Secondary | ICD-10-CM

## 2021-01-21 DIAGNOSIS — S022XXB Fracture of nasal bones, initial encounter for open fracture: Secondary | ICD-10-CM | POA: Diagnosis not present

## 2021-01-21 DIAGNOSIS — S0993XA Unspecified injury of face, initial encounter: Secondary | ICD-10-CM | POA: Diagnosis present

## 2021-01-21 DIAGNOSIS — Y92009 Unspecified place in unspecified non-institutional (private) residence as the place of occurrence of the external cause: Secondary | ICD-10-CM | POA: Insufficient documentation

## 2021-01-21 MED ORDER — CEPHALEXIN 500 MG PO CAPS: 500.0000 mg | ORAL_CAPSULE | Freq: Two times a day (BID) | ORAL | 0 refills | Status: DC

## 2021-01-21 MED ORDER — LIDOCAINE-EPINEPHRINE (PF) 2 %-1:200000 IJ SOLN
INTRAMUSCULAR | Status: AC
  Administered 2021-01-21: 20 mL
  Filled 2021-01-21: qty 20

## 2021-01-21 MED ORDER — LIDOCAINE-EPINEPHRINE (PF) 2 %-1:200000 IJ SOLN: 20.0000 mL | Freq: Once | INTRAMUSCULAR | Status: AC

## 2021-01-21 NOTE — ED Triage Notes (Signed)
Pt was at family home, pt tripped and fell going into house. Hitting his head on cement steps. ETOH on board. Denies LOC.

## 2021-01-21 NOTE — Discharge Instructions (Signed)
Follow-up with Dr. Pollyann Kennedy next week for your nasal fracture.  Clean your laceration gently with soap and water daily.  Sutures need to be removed in 5 days he can go to family doctor or see Dr. Pollyann Kennedy or an urgent care.  You have 8 sutures.  You can take Motrin or Tylenol for pain

## 2021-01-21 NOTE — ED Provider Notes (Signed)
[facility] EMERGENCY DEPARTMENT Provider Note   CSN: [id number] Arrival date & time: 01/21/21  2138     History Chief Complaint  Patient presents with  . Fall    Harold Rasmussen is a 20 y.o. male.  Patient with a fall and no loss of consciousness.  Patient complains of a laceration to his fourth  The history is provided by the patient and medical records. No language interpreter was used.  Fall This is a new problem. The current episode started 6 to 12 hours ago. The problem occurs rarely. The problem has been resolved. Pertinent negatives include no chest pain, no abdominal pain and no headaches. Nothing aggravates the symptoms. Nothing relieves the symptoms.       History reviewed. No pertinent past medical history.  Patient Active Problem List   Diagnosis Date Noted  . Other atopic dermatitis 10/18/2020  . Bilateral impacted cerumen 08/05/2020    History reviewed. No pertinent surgical history.     History reviewed. No pertinent family history.  Social History   Tobacco Use  . Smoking status: Never Smoker  . Smokeless tobacco: Never Used  Vaping Use  . Vaping Use: Never used  Substance Use Topics  . Alcohol use: No  . Drug use: Never    Home Medications Prior to Admission medications   Medication Sig Start Date End Date Taking? Authorizing Provider  acetaminophen (TYLENOL) 325 MG tablet Take 650 mg by mouth every 6 (six) hours as needed (headache). Patient not taking: Reported on 10/18/2020    [provider]  [employer] RLF 10 MG tablet TAKE 1 TABLET BY MOUTH EVERY DAY 10/31/20   Mechele Claude, MD  diclofenac (VOLTAREN) 75 MG EC tablet Take 1 tablet (75 mg total) by mouth 2 (two) times daily. For muscle and  Joint pain Patient not taking: Reported on 10/18/2020 11/30/19   Mechele Claude, MD  hydrocortisone 1 % lotion Apply 1 application topically 2 (two) times daily. 10/18/20   Daryll Drown, NP    Allergies    Patient has  no known allergies.  Review of Systems   Review of Systems  Constitutional: Negative for appetite change and fatigue.  HENT: Negative for congestion, ear discharge and sinus pressure.        Headache  Eyes: Negative for discharge.  Respiratory: Negative for cough.   Cardiovascular: Negative for chest pain.  Gastrointestinal: Negative for abdominal pain and diarrhea.  Genitourinary: Negative for frequency and hematuria.  Musculoskeletal: Negative for back pain.  Skin: Negative for rash.  Neurological: Negative for seizures and headaches.  Psychiatric/Behavioral: Negative for hallucinations.    Physical Exam Updated Vital Signs BP (!) 112/58 (BP Location: Right Arm)   Pulse 65   Temp 97.9 F (36.6 C) (Oral)   Resp 17   Ht 6\' 2"  (1.88 m)   Wt 92.5 kg   SpO2 99%   BMI 26.19 kg/m   Physical Exam Vitals and nursing note reviewed.  Constitutional:      Appearance: He is well-developed.  HENT:     Head: Normocephalic.     Comments: 3 cm laceration to forehead with swelling tenderness to the superior nose    Nose: Nose normal.  Eyes:     General: No scleral icterus.    Conjunctiva/sclera: Conjunctivae normal.  Neck:     Thyroid: No thyromegaly.  Cardiovascular:     Rate and Rhythm: Normal rate and regular rhythm.     Heart sounds: No murmur heard. No  friction rub. No gallop.   Pulmonary:     Breath sounds: No stridor. No wheezing or rales.  Chest:     Chest wall: No tenderness.  Abdominal:     General: There is no distension.     Tenderness: There is no abdominal tenderness. There is no rebound.  Musculoskeletal:        General: Normal range of motion.     Cervical back: Neck supple.  Lymphadenopathy:     Cervical: No cervical adenopathy.  Skin:    Findings: No erythema or rash.  Neurological:     Mental Status: He is alert and oriented to person, place, and time.     Motor: No abnormal muscle tone.     Coordination: Coordination normal.  Psychiatric:         Behavior: Behavior normal.     ED Results / Procedures / Treatments   Labs (all labs ordered are listed, but only abnormal results are displayed) Labs Reviewed - No data to display  EKG None  Radiology CT Head Wo Contrast  Result Date: 01/21/2021 CLINICAL DATA:  Tripped and fell going into house, struck head on cement steps, intoxicated, headache and neck stiffness/pain EXAM: CT HEAD WITHOUT CONTRAST CT CERVICAL SPINE WITHOUT CONTRAST TECHNIQUE: Multidetector CT imaging of the head and cervical spine was performed following the standard protocol without intravenous contrast. Multiplanar CT image reconstructions of the cervical spine were also generated. COMPARISON:  None. FINDINGS: CT HEAD FINDINGS Brain: No evidence of acute infarction, hemorrhage, hydrocephalus, extra-axial collection, visible mass lesion or mass effect. Vascular: No hyperdense vessel or unexpected calcification. Skull: Right frontal midline scalp laceration at the glabella with cutaneous defect. Additional laceration across the superior nasal bridge with fractures of the bilateral nasal bones and angulated segmental fracture of the bony nasal septum. No calvarial fracture. No other acute osseous abnormalities. Multiple sites of soft tissue thickening of the posterior dermal scalp with a larger ovoid cutaneous nodule in the posterior midline parietal scalp measuring 39 Hounsfield units with a size of 1.8 x 1.2 cm in maximum transaxial dimension. Sinuses/Orbits: Nasal bone fractures as above, only partially included within the level of imaging. Minimal thickening in the anterior ethmoids and nasal passages. Remaining paranasal sinuses are predominantly clear. Mastoid air cells are clear. Middle ear cavities are clear. Debris noted in the bilateral external auditory canals. Bilateral periorbital stranding, swelling predominately along the medial orbits adjacent nasal bone fractures. No retro septal gas, stranding or hemorrhage. Globes  are unremarkable. Remaining retro septal contents are free of acute abnormality. Other: None CT CERVICAL SPINE FINDINGS Alignment: Slight reversal the normal cervical lordosis likely accentuated by flexion despite the presence of a stabilization collar. No evidence of traumatic listhesis. No abnormally widened, perched or jumped facets. Normal alignment of the craniocervical and atlantoaxial articulations. Skull base and vertebrae: No acute skull base fracture. No vertebral body fracture or height loss. Normal bone mineralization. No worrisome osseous lesions. Soft tissues and spinal canal: No pre or paravertebral fluid or swelling. No visible canal hematoma. Disc levels: No significant central canal or foraminal stenosis identified within the imaged levels of the spine. Upper chest: No acute abnormality in the upper chest or imaged lung apices. Other: 2 cm hypoattenuating nodule in the left thyroid gland with some punctate calcification along the periphery. IMPRESSION: 1. No acute intracranial abnormality. 2. Frontal midline scalp laceration at the glabella with cutaneous defect. No calvarial fracture. 3. Additional laceration across the superior nasal bridge with fractures of the  bilateral nasal bones and angulated segmental fracture of the bony nasal septum only partially included within the imaging. Correlate with clinical findings and if there is concern for more extensive facial bone injuries, maxillofacial CT could be obtained. 4. Bilateral periorbital stranding, swelling predominately along the medial orbits adjacent nasal bone fractures. No globe injury, retro septal gas, stranding or hemorrhage. 5. No acute fracture or traumatic listhesis of the cervical spine. 6. Debris in the bilateral external auditory canals, correlate for cerumen impaction. 7. 2 cm hypoattenuating nodule in the left thyroid gland with some punctate calcification along the periphery. Recommend thyroid US (ref: J Am Coll Radiol. 2015  Feb;12(2): 143-50). 8. Multiple sites of soft tissue thickening of the posterior dermal scalp with a larger ovoid cutaneous nodule in the posterior midline parietal scalp measuring 39 Hounsfield units with a size of 1.8 x 1.2 cm in maximum transaxial dimension. Correlate with visual inspection for the presence of dermal inclusion cysts other cutaneous processes, unlikely to be acute. Electronically Signed   By: Kreg Shropshire M.D.   On: 01/21/2021 22:33   CT Cervical Spine Wo Contrast  Result Date: 01/21/2021 CLINICAL DATA:  Tripped and fell going into house, struck head on cement steps, intoxicated, headache and neck stiffness/pain EXAM: CT HEAD WITHOUT CONTRAST CT CERVICAL SPINE WITHOUT CONTRAST TECHNIQUE: Multidetector CT imaging of the head and cervical spine was performed following the standard protocol without intravenous contrast. Multiplanar CT image reconstructions of the cervical spine were also generated. COMPARISON:  None. FINDINGS: CT HEAD FINDINGS Brain: No evidence of acute infarction, hemorrhage, hydrocephalus, extra-axial collection, visible mass lesion or mass effect. Vascular: No hyperdense vessel or unexpected calcification. Skull: Right frontal midline scalp laceration at the glabella with cutaneous defect. Additional laceration across the superior nasal bridge with fractures of the bilateral nasal bones and angulated segmental fracture of the bony nasal septum. No calvarial fracture. No other acute osseous abnormalities. Multiple sites of soft tissue thickening of the posterior dermal scalp with a larger ovoid cutaneous nodule in the posterior midline parietal scalp measuring 39 Hounsfield units with a size of 1.8 x 1.2 cm in maximum transaxial dimension. Sinuses/Orbits: Nasal bone fractures as above, only partially included within the level of imaging. Minimal thickening in the anterior ethmoids and nasal passages. Remaining paranasal sinuses are predominantly clear. Mastoid air cells are  clear. Middle ear cavities are clear. Debris noted in the bilateral external auditory canals. Bilateral periorbital stranding, swelling predominately along the medial orbits adjacent nasal bone fractures. No retro septal gas, stranding or hemorrhage. Globes are unremarkable. Remaining retro septal contents are free of acute abnormality. Other: None CT CERVICAL SPINE FINDINGS Alignment: Slight reversal the normal cervical lordosis likely accentuated by flexion despite the presence of a stabilization collar. No evidence of traumatic listhesis. No abnormally widened, perched or jumped facets. Normal alignment of the craniocervical and atlantoaxial articulations. Skull base and vertebrae: No acute skull base fracture. No vertebral body fracture or height loss. Normal bone mineralization. No worrisome osseous lesions. Soft tissues and spinal canal: No pre or paravertebral fluid or swelling. No visible canal hematoma. Disc levels: No significant central canal or foraminal stenosis identified within the imaged levels of the spine. Upper chest: No acute abnormality in the upper chest or imaged lung apices. Other: 2 cm hypoattenuating nodule in the left thyroid gland with some punctate calcification along the periphery. IMPRESSION: 1. No acute intracranial abnormality. 2. Frontal midline scalp laceration at the glabella with cutaneous defect. No calvarial fracture. 3. Additional laceration  across the superior nasal bridge with fractures of the bilateral nasal bones and angulated segmental fracture of the bony nasal septum only partially included within the imaging. Correlate with clinical findings and if there is concern for more extensive facial bone injuries, maxillofacial CT could be obtained. 4. Bilateral periorbital stranding, swelling predominately along the medial orbits adjacent nasal bone fractures. No globe injury, retro septal gas, stranding or hemorrhage. 5. No acute fracture or traumatic listhesis of the  cervical spine. 6. Debris in the bilateral external auditory canals, correlate for cerumen impaction. 7. 2 cm hypoattenuating nodule in the left thyroid gland with some punctate calcification along the periphery. Recommend thyroid US (ref: J Am Coll Radiol. 2015 Feb;12(2): 143-50). 8. Multiple sites of soft tissue thickening of the posterior dermal scalp with a larger ovoid cutaneous nodule in the posterior midline parietal scalp measuring 39 Hounsfield units with a size of 1.8 x 1.2 cm in maximum transaxial dimension. Correlate with visual inspection for the presence of dermal inclusion cysts other cutaneous processes, unlikely to be acute. Electronically Signed   By: Kreg Shropshire M.D.   On: 01/21/2021 22:33    Procedures .Marland KitchenLaceration Repair  Date/Time: 01/24/2021 10:17 AM Performed by: Bethann Berkshire, MD Authorized by: Bethann Berkshire, MD   Comments:     Patient is a 3 cm laceration across his forehead.  Area was cleaned thoroughly with Betadine with 1% lidocaine with epi used to anesthetize the area.  Laceration was closed with 5-0 nylon sutures.  And the patient tolerated the procedure well     Medications Ordered in ED Medications  lidocaine-EPINEPHrine (XYLOCAINE W/EPI) 2 %-1:200000 (PF) injection 20 mL (20 mLs Infiltration Given by Other 01/21/21 2241)    ED Course  I have reviewed the triage vital signs and the nursing notes.  Pertinent labs & imaging results that were available during my care of the patient were reviewed by me and considered in my medical decision making (see chart for details). Patient with a laceration to his forehead 16 with long.  He was anesthetized with 1% lidocaine with epi and 7 sutures were used to close laceration to his forehead and one suture was used to close laceration to the superior part of his nose   MDM Rules/Calculators/A&P                          Patient with a fall to his head.  CT scan shows no intracranial problems but nasal fracture.   Patient had a 3 cm laceration across his forehead.  This area has been closed with 5-0 suture patient will follow up with Dr. Pollyann Kennedy Final Clinical Impression(s) / ED Diagnoses Final diagnoses:  Fall, initial encounter  Open fracture of nasal bone, initial encounter    Rx / DC Orders ED Discharge Orders    None       Bethann Berkshire, MD 01/24/21 1018

## 2021-01-23 ENCOUNTER — Emergency Department (HOSPITAL_COMMUNITY)
Admission: EM | Admit: 2021-01-23 | Discharge: 2021-01-23 | Disposition: A | Discharge status: Home / Self Care | Payer: 59 | Attending: Emergency Medicine | Admitting: Emergency Medicine

## 2021-01-23 ENCOUNTER — Encounter (HOSPITAL_COMMUNITY): Payer: Self-pay | Admitting: *Deleted

## 2021-01-23 DIAGNOSIS — R220 Localized swelling, mass and lump, head: Secondary | ICD-10-CM | POA: Insufficient documentation

## 2021-01-23 DIAGNOSIS — Z4801 Encounter for change or removal of surgical wound dressing: Secondary | ICD-10-CM | POA: Diagnosis present

## 2021-01-23 DIAGNOSIS — Z5189 Encounter for other specified aftercare: Secondary | ICD-10-CM

## 2021-01-23 NOTE — ED Notes (Addendum)
Pt c/o increased swelling to left eye. Denies changes in vision. States he came to ED Saturday night and has stitches to forehead.

## 2021-01-23 NOTE — ED Provider Notes (Signed)
[facility] EMERGENCY DEPARTMENT Provider Note   CSN: [id number] Arrival date & time: 01/23/21  1529     History Chief Complaint  Patient presents with  . Wound Check    Harold Rasmussen is a 20 y.o. male who presents for wound check.  Patient states he got really drunk, blacked out, fell and hit his face on concrete 2 nights ago.  He also broke his nose.  He was seen in the emergency department had stitches, CT head and C-spine which were negative for acute fracture.  Patient states that he did not realize he would have this much swelling.  He is able to breathe through his nose.  He was concerned about swelling around his eyes.  He denies any other complaints at this time  HPI     History reviewed. No pertinent past medical history.  Patient Active Problem List   Diagnosis Date Noted  . Other atopic dermatitis 10/18/2020  . Bilateral impacted cerumen 08/05/2020    History reviewed. No pertinent surgical history.     No family history on file.  Social History   Tobacco Use  . Smoking status: Never Smoker  . Smokeless tobacco: Never Used  Vaping Use  . Vaping Use: Never used  Substance Use Topics  . Alcohol use: No  . Drug use: Never    Home Medications Prior to Admission medications   Medication Sig Start Date End Date Taking? Authorizing Provider  acetaminophen (TYLENOL) 325 MG tablet Take 650 mg by mouth every 6 (six) hours as needed (headache). Patient not taking: Reported on 10/18/2020    [provider]  cephALEXin (KEFLEX) 500 MG capsule Take 1 capsule (500 mg total) by mouth 2 (two) times daily. 01/21/21   Bethann Berkshire, MD  [employer] RLF 10 MG tablet TAKE 1 TABLET BY MOUTH EVERY DAY 10/31/20   Mechele Claude, MD  diclofenac (VOLTAREN) 75 MG EC tablet Take 1 tablet (75 mg total) by mouth 2 (two) times daily. For muscle and  Joint pain Patient not taking: Reported on 10/18/2020 11/30/19   Mechele Claude, MD  hydrocortisone 1 %  lotion Apply 1 application topically 2 (two) times daily. 10/18/20   Daryll Drown, NP    Allergies    Patient has no known allergies.  Review of Systems   Review of Systems  HENT: Positive for facial swelling.   Eyes: Negative for visual disturbance.    Physical Exam Updated Vital Signs BP 129/89 (BP Location: Right Arm)   Pulse (!) 57   Temp 98.7 F (37.1 C) (Oral)   Resp 18   Ht 6\' 2"  (1.88 m)   Wt 88.7 kg   SpO2 100%   BMI 25.11 kg/m   Physical Exam Vitals and nursing note reviewed.  Constitutional:      General: He is not in acute distress.    Appearance: He is well-developed. He is not diaphoretic.  HENT:     Head: Normocephalic.     Comments: Well-healing laceration and abrasion to the forehead and nasal bridge.  Eyes are open but there is some swelling. Eyes:     General: No scleral icterus.    Extraocular Movements: Extraocular movements intact.     Conjunctiva/sclera: Conjunctivae normal.     Pupils: Pupils are equal, round, and reactive to light.  Cardiovascular:     Rate and Rhythm: Normal rate and regular rhythm.     Heart sounds: Normal heart sounds.  Pulmonary:     Effort:  Pulmonary effort is normal. No respiratory distress.     Breath sounds: Normal breath sounds.  Abdominal:     Palpations: Abdomen is soft.     Tenderness: There is no abdominal tenderness.  Musculoskeletal:     Cervical back: Normal range of motion and neck supple.  Skin:    General: Skin is warm and dry.  Neurological:     Mental Status: He is alert.  Psychiatric:        Behavior: Behavior normal.     ED Results / Procedures / Treatments   Labs (all labs ordered are listed, but only abnormal results are displayed) Labs Reviewed - No data to display  EKG None  Radiology CT Head Wo Contrast  Result Date: 01/21/2021 CLINICAL DATA:  Tripped and fell going into house, struck head on cement steps, intoxicated, headache and neck stiffness/pain EXAM: CT HEAD WITHOUT  CONTRAST CT CERVICAL SPINE WITHOUT CONTRAST TECHNIQUE: Multidetector CT imaging of the head and cervical spine was performed following the standard protocol without intravenous contrast. Multiplanar CT image reconstructions of the cervical spine were also generated. COMPARISON:  None. FINDINGS: CT HEAD FINDINGS Brain: No evidence of acute infarction, hemorrhage, hydrocephalus, extra-axial collection, visible mass lesion or mass effect. Vascular: No hyperdense vessel or unexpected calcification. Skull: Right frontal midline scalp laceration at the glabella with cutaneous defect. Additional laceration across the superior nasal bridge with fractures of the bilateral nasal bones and angulated segmental fracture of the bony nasal septum. No calvarial fracture. No other acute osseous abnormalities. Multiple sites of soft tissue thickening of the posterior dermal scalp with a larger ovoid cutaneous nodule in the posterior midline parietal scalp measuring 39 Hounsfield units with a size of 1.8 x 1.2 cm in maximum transaxial dimension. Sinuses/Orbits: Nasal bone fractures as above, only partially included within the level of imaging. Minimal thickening in the anterior ethmoids and nasal passages. Remaining paranasal sinuses are predominantly clear. Mastoid air cells are clear. Middle ear cavities are clear. Debris noted in the bilateral external auditory canals. Bilateral periorbital stranding, swelling predominately along the medial orbits adjacent nasal bone fractures. No retro septal gas, stranding or hemorrhage. Globes are unremarkable. Remaining retro septal contents are free of acute abnormality. Other: None CT CERVICAL SPINE FINDINGS Alignment: Slight reversal the normal cervical lordosis likely accentuated by flexion despite the presence of a stabilization collar. No evidence of traumatic listhesis. No abnormally widened, perched or jumped facets. Normal alignment of the craniocervical and atlantoaxial articulations.  Skull base and vertebrae: No acute skull base fracture. No vertebral body fracture or height loss. Normal bone mineralization. No worrisome osseous lesions. Soft tissues and spinal canal: No pre or paravertebral fluid or swelling. No visible canal hematoma. Disc levels: No significant central canal or foraminal stenosis identified within the imaged levels of the spine. Upper chest: No acute abnormality in the upper chest or imaged lung apices. Other: 2 cm hypoattenuating nodule in the left thyroid gland with some punctate calcification along the periphery. IMPRESSION: 1. No acute intracranial abnormality. 2. Frontal midline scalp laceration at the glabella with cutaneous defect. No calvarial fracture. 3. Additional laceration across the superior nasal bridge with fractures of the bilateral nasal bones and angulated segmental fracture of the bony nasal septum only partially included within the imaging. Correlate with clinical findings and if there is concern for more extensive facial bone injuries, maxillofacial CT could be obtained. 4. Bilateral periorbital stranding, swelling predominately along the medial orbits adjacent nasal bone fractures. No globe injury, retro septal  gas, stranding or hemorrhage. 5. No acute fracture or traumatic listhesis of the cervical spine. 6. Debris in the bilateral external auditory canals, correlate for cerumen impaction. 7. 2 cm hypoattenuating nodule in the left thyroid gland with some punctate calcification along the periphery. Recommend thyroid US (ref: J Am Coll Radiol. 2015 Feb;12(2): 143-50). 8. Multiple sites of soft tissue thickening of the posterior dermal scalp with a larger ovoid cutaneous nodule in the posterior midline parietal scalp measuring 39 Hounsfield units with a size of 1.8 x 1.2 cm in maximum transaxial dimension. Correlate with visual inspection for the presence of dermal inclusion cysts other cutaneous processes, unlikely to be acute. Electronically Signed    By: Kreg Shropshire M.D.   On: 01/21/2021 22:33   CT Cervical Spine Wo Contrast  Result Date: 01/21/2021 CLINICAL DATA:  Tripped and fell going into house, struck head on cement steps, intoxicated, headache and neck stiffness/pain EXAM: CT HEAD WITHOUT CONTRAST CT CERVICAL SPINE WITHOUT CONTRAST TECHNIQUE: Multidetector CT imaging of the head and cervical spine was performed following the standard protocol without intravenous contrast. Multiplanar CT image reconstructions of the cervical spine were also generated. COMPARISON:  None. FINDINGS: CT HEAD FINDINGS Brain: No evidence of acute infarction, hemorrhage, hydrocephalus, extra-axial collection, visible mass lesion or mass effect. Vascular: No hyperdense vessel or unexpected calcification. Skull: Right frontal midline scalp laceration at the glabella with cutaneous defect. Additional laceration across the superior nasal bridge with fractures of the bilateral nasal bones and angulated segmental fracture of the bony nasal septum. No calvarial fracture. No other acute osseous abnormalities. Multiple sites of soft tissue thickening of the posterior dermal scalp with a larger ovoid cutaneous nodule in the posterior midline parietal scalp measuring 39 Hounsfield units with a size of 1.8 x 1.2 cm in maximum transaxial dimension. Sinuses/Orbits: Nasal bone fractures as above, only partially included within the level of imaging. Minimal thickening in the anterior ethmoids and nasal passages. Remaining paranasal sinuses are predominantly clear. Mastoid air cells are clear. Middle ear cavities are clear. Debris noted in the bilateral external auditory canals. Bilateral periorbital stranding, swelling predominately along the medial orbits adjacent nasal bone fractures. No retro septal gas, stranding or hemorrhage. Globes are unremarkable. Remaining retro septal contents are free of acute abnormality. Other: None CT CERVICAL SPINE FINDINGS Alignment: Slight reversal the  normal cervical lordosis likely accentuated by flexion despite the presence of a stabilization collar. No evidence of traumatic listhesis. No abnormally widened, perched or jumped facets. Normal alignment of the craniocervical and atlantoaxial articulations. Skull base and vertebrae: No acute skull base fracture. No vertebral body fracture or height loss. Normal bone mineralization. No worrisome osseous lesions. Soft tissues and spinal canal: No pre or paravertebral fluid or swelling. No visible canal hematoma. Disc levels: No significant central canal or foraminal stenosis identified within the imaged levels of the spine. Upper chest: No acute abnormality in the upper chest or imaged lung apices. Other: 2 cm hypoattenuating nodule in the left thyroid gland with some punctate calcification along the periphery. IMPRESSION: 1. No acute intracranial abnormality. 2. Frontal midline scalp laceration at the glabella with cutaneous defect. No calvarial fracture. 3. Additional laceration across the superior nasal bridge with fractures of the bilateral nasal bones and angulated segmental fracture of the bony nasal septum only partially included within the imaging. Correlate with clinical findings and if there is concern for more extensive facial bone injuries, maxillofacial CT could be obtained. 4. Bilateral periorbital stranding, swelling predominately along the medial orbits  adjacent nasal bone fractures. No globe injury, retro septal gas, stranding or hemorrhage. 5. No acute fracture or traumatic listhesis of the cervical spine. 6. Debris in the bilateral external auditory canals, correlate for cerumen impaction. 7. 2 cm hypoattenuating nodule in the left thyroid gland with some punctate calcification along the periphery. Recommend thyroid US (ref: J Am Coll Radiol. 2015 Feb;12(2): 143-50). 8. Multiple sites of soft tissue thickening of the posterior dermal scalp with a larger ovoid cutaneous nodule in the posterior  midline parietal scalp measuring 39 Hounsfield units with a size of 1.8 x 1.2 cm in maximum transaxial dimension. Correlate with visual inspection for the presence of dermal inclusion cysts other cutaneous processes, unlikely to be acute. Electronically Signed   By: Kreg Shropshire M.D.   On: 01/21/2021 22:33    Procedures Procedures   Medications Ordered in ED Medications - No data to display  ED Course  I have reviewed the triage vital signs and the nursing notes.  Pertinent labs & imaging results that were available during my care of the patient were reviewed by me and considered in my medical decision making (see chart for details).    MDM Rules/Calculators/A&P                          Patient with facial swelling after facial injury.  No obvious infection.  We discussed the fact that this is normal and he should sleep with the head of his bed elevated and apply ice.  He is able to breathe through his nose.  He appears otherwise appropriate for discharge.  Discussed supportive care and return precautions. Final Clinical Impression(s) / ED Diagnoses Final diagnoses:  Visit for wound check    Rx / DC Orders ED Discharge Orders    None       Arthor Captain, PA-C 01/23/21 1753    Terrilee Files, MD 01/24/21 1038

## 2021-01-23 NOTE — ED Triage Notes (Signed)
Here for recheck of facial wound, seen 2 days ago, concerned he cannot open his left eye

## 2021-01-23 NOTE — Discharge Instructions (Signed)
Get help right away if: You have severe pain or a headache that is not relieved by medicine. You have unusual sleepiness, confusion, or personality changes. You vomit. You have a nosebleed that does not stop. You have double vision or blurred vision. You have a continuous clear fluid draining from your nose or ear. You have trouble walking or using your arms or legs. You have severe dizziness. 

## 2021-01-24 ENCOUNTER — Ambulatory Visit: Payer: 59 | Admitting: Family Medicine

## 2021-01-26 ENCOUNTER — Other Ambulatory Visit: Payer: Self-pay

## 2021-01-26 ENCOUNTER — Encounter: Payer: Self-pay | Admitting: Nurse Practitioner

## 2021-01-26 ENCOUNTER — Ambulatory Visit: Payer: 59 | Admitting: Nurse Practitioner

## 2021-01-26 VITALS — BP 122/79 | HR 59 | Temp 98.2°F | Resp 20 | Ht 74.0 in | Wt 190.0 lb

## 2021-01-26 DIAGNOSIS — S022XXA Fracture of nasal bones, initial encounter for closed fracture: Secondary | ICD-10-CM | POA: Diagnosis not present

## 2021-01-26 DIAGNOSIS — Z4802 Encounter for removal of sutures: Secondary | ICD-10-CM

## 2021-01-26 NOTE — Progress Notes (Signed)
   Subjective:    Patient ID: Harold Rasmussen, male    DOB: July 24, 2001, 20 y.o.   MRN: [id number]   Chief Complaint: Suture / Staple Removal   HPI Patient fell last Saturday night and hit his head on concrete and lacerated his forehead and broke his nose. He had to go to the emergency room and have stitches put in forehead. They told him that he needs to see ENT for his nose. He needs referral for that.     Review of Systems  Constitutional: Negative.   Eyes: Negative for photophobia and visual disturbance.  Respiratory: Negative.   Cardiovascular: Negative.   Gastrointestinal: Negative for nausea and vomiting.  Musculoskeletal: Negative.   Neurological: Negative for dizziness and facial asymmetry.  Psychiatric/Behavioral: Negative.   All other systems reviewed and are negative.      Objective:   Physical Exam Vitals and nursing note reviewed.  Constitutional:      Appearance: Normal appearance.  Cardiovascular:     Rate and Rhythm: Normal rate and regular rhythm.     Heart sounds: Normal heart sounds.  Pulmonary:     Effort: Pulmonary effort is normal.     Breath sounds: Normal breath sounds.  Skin:    Comments: lacertion to forehead has closed wound edges, no discharge   Neurological:     Mental Status: He is alert.  Psychiatric:        Mood and Affect: Mood normal.        Behavior: Behavior normal.        Thought Content: Thought content normal.        Judgment: Judgment normal.    BP 122/79   Pulse (!) 59   Temp 98.2 F (36.8 C) (Temporal)   Resp 20   Ht 6\' 2"  (1.88 m)   Wt 190 lb (86.2 kg)   SpO2 100%   BMI 24.39 kg/m        Assessment & Plan:  Harold Rasmussen in today with chief complaint of Suture / Staple Removal   1. Closed fracture of nasal bone, initial encounter Do not push on nose - Ambulatory referral to ENT  2. Visit for suture removal 8 stitches removed steristrips applied.- they will fall off by themselves.    The above  assessment and management plan was discussed with the patient. The patient verbalized understanding of and has agreed to the management plan. Patient is aware to call the clinic if symptoms persist or worsen. Patient is aware when to return to the clinic for a follow-up visit. Patient educated on when it is appropriate to go to the emergency department.   Mary-Margaret Doyle Askew, FNP

## 2021-02-06 ENCOUNTER — Telehealth: Payer: Self-pay

## 2021-02-14 ENCOUNTER — Telehealth: Payer: Self-pay | Admitting: Family Medicine

## 2021-02-23 ENCOUNTER — Telehealth: Payer: Self-pay | Admitting: Family Medicine

## 2021-02-23 NOTE — Telephone Encounter (Signed)
Returned call, busy signal 

## 2021-03-01 NOTE — Telephone Encounter (Signed)
RETURNED CALL, BUSY SIGNAL ONLY 

## 2021-03-16 ENCOUNTER — Ambulatory Visit: Payer: 59

## 2021-03-16 NOTE — Telephone Encounter (Signed)
Pts mom called requesting that nurse call her back regarding pt's CT results.  Call [place] at [phone]

## 2021-03-16 NOTE — Telephone Encounter (Signed)
Please review CT reports from April. Patient went to ER for injury. Received letter in mail afterward regarding incidental findings. Mother wants to know if any further work up or imaging needed.

## 2021-03-19 NOTE — Telephone Encounter (Signed)
HE needs an office visit to review the scans and decide on next steps

## 2021-03-20 NOTE — Telephone Encounter (Signed)
Patient called and appointment scheduled for Wednesday at 2:55 with [facility]. I attempted to contact mother but phone number had a busy signal.

## 2021-03-22 ENCOUNTER — Encounter: Payer: Self-pay | Admitting: Family Medicine

## 2021-03-22 ENCOUNTER — Ambulatory Visit (INDEPENDENT_AMBULATORY_CARE_PROVIDER_SITE_OTHER): Payer: 59

## 2021-03-22 ENCOUNTER — Other Ambulatory Visit: Payer: Self-pay

## 2021-03-22 ENCOUNTER — Ambulatory Visit (INDEPENDENT_AMBULATORY_CARE_PROVIDER_SITE_OTHER): Payer: 59 | Admitting: Family Medicine

## 2021-03-22 VITALS — BP 127/75 | HR 76 | Temp 98.4°F | Ht 74.0 in | Wt 194.0 lb

## 2021-03-22 DIAGNOSIS — J301 Allergic rhinitis due to pollen: Secondary | ICD-10-CM | POA: Diagnosis not present

## 2021-03-22 DIAGNOSIS — S60041A Contusion of right ring finger without damage to nail, initial encounter: Secondary | ICD-10-CM

## 2021-03-22 DIAGNOSIS — E041 Nontoxic single thyroid nodule: Secondary | ICD-10-CM | POA: Diagnosis not present

## 2021-03-22 MED ORDER — FEXOFENADINE HCL 180 MG PO TABS: 180.0000 mg | ORAL_TABLET | Freq: Every day | ORAL | 11 refills | Status: DC

## 2021-03-22 NOTE — Progress Notes (Signed)
Chief Complaint  Patient presents with   Results    HPI  Patient presents today for CT report of head and neck done after injury 2 months ago. He got drunk and fell breaking his nose. Pt. Confirms thisfrom E.D. report. Pt. Now healed, but some scarring of a forehead laceration. CT also revealed a potential nodule in the thyroid. [place] was recommended.  Right ring Finger stuck in a fan blade 5 days ago. Laceration healing. Wondering if bone was broken by the fan blade  Sneezing and sniffling a lot. Some mucoid drainage. Had sinnus thickening on CT 2 months ago.  PMH: Smoking status noted ROS: Review of Systems  Constitutional:  Negative for activity change and fever.  HENT:  Positive for congestion, postnasal drip and sneezing.   Neurological:  Negative for dizziness and headaches.    Objective: BP 127/75   Pulse 76   Temp 98.4 F (36.9 C)   Ht 6\' 2"  (1.88 m)   Wt 194 lb (88 kg)   SpO2 97%   BMI 24.91 kg/m  Gen: NAD, alert, cooperative with exam HEENT: NCAT, EOMI, PERRL 1 cm nodule, smooth, left lobe of thyroid.  CV: RRR, good S1/S2, no murmur Resp: CTABL, no wheezes, non-labored Abd: SNTND, BS present, no guarding or organomegaly Ext: No edema, warm. Healing, closed laceration at [facility] of right 4th finger 1.3 cm closed, crusted and new skin forming.  Neuro: Alert and oriented, No gross deficits  Assessment and plan:  1. Contusion of right ring finger without damage to nail, initial encounter   2. Thyroid nodule   3. Seasonal allergic rhinitis due to pollen     Meds ordered this encounter  Medications   fexofenadine (ALLEGRA) 180 MG tablet    Sig: Take 1 tablet (180 mg total) by mouth daily. For allergy symptoms    Dispense:  30 tablet    Refill:  11    Orders Placed This Encounter  Procedures   DG Finger Ring Right    Standing Status:   Future    Number of Occurrences:   1    Standing Expiration Date:   04/21/2021    Order Specific Question:   Reason for Exam  (SYMPTOM  OR DIAGNOSIS REQUIRED)    Answer:   contusion of finger    Order Specific Question:   Preferred imaging location?    Answer:   Internal   04/23/2021 THYROID    Standing Status:   Future    Standing Expiration Date:   04/21/2021    Order Specific Question:   Reason for Exam (SYMPTOM  OR DIAGNOSIS REQUIRED)    Answer:   nodule on CT neck    Order Specific Question:   Preferred imaging location?    Answer:   [facility]    Follow up as needed.  [facility], MD

## 2021-03-25 ENCOUNTER — Other Ambulatory Visit: Payer: Self-pay | Admitting: Family Medicine

## 2021-03-25 DIAGNOSIS — M20021 Boutonniere deformity of right finger(s): Secondary | ICD-10-CM

## 2021-03-25 NOTE — Progress Notes (Signed)
Pt. Was notified of results and plan of action. WS

## 2021-03-31 ENCOUNTER — Ambulatory Visit (HOSPITAL_COMMUNITY)
Admission: RE | Admit: 2021-03-31 | Discharge: 2021-03-31 | Disposition: A | Discharge status: Home / Self Care | Payer: 59 | Source: Ambulatory Visit | Attending: Family Medicine | Admitting: Family Medicine

## 2021-03-31 ENCOUNTER — Other Ambulatory Visit: Payer: Self-pay

## 2021-03-31 DIAGNOSIS — E041 Nontoxic single thyroid nodule: Secondary | ICD-10-CM | POA: Insufficient documentation

## 2021-04-03 ENCOUNTER — Other Ambulatory Visit: Payer: Self-pay | Admitting: Family Medicine

## 2021-04-03 DIAGNOSIS — E041 Nontoxic single thyroid nodule: Secondary | ICD-10-CM

## 2021-04-07 ENCOUNTER — Ambulatory Visit: Payer: 59 | Admitting: Physician Assistant

## 2021-04-11 ENCOUNTER — Other Ambulatory Visit (HOSPITAL_COMMUNITY): Payer: Self-pay | Admitting: Orthopedic Surgery

## 2021-04-12 ENCOUNTER — Encounter (HOSPITAL_COMMUNITY): Payer: Self-pay

## 2021-04-12 ENCOUNTER — Other Ambulatory Visit: Payer: Self-pay | Admitting: Radiology

## 2021-04-12 ENCOUNTER — Ambulatory Visit (HOSPITAL_COMMUNITY)
Admission: RE | Admit: 2021-04-12 | Discharge: 2021-04-12 | Disposition: A | Discharge status: Home / Self Care | Payer: 59 | Source: Ambulatory Visit | Attending: Family Medicine | Admitting: Family Medicine

## 2021-04-12 DIAGNOSIS — E041 Nontoxic single thyroid nodule: Secondary | ICD-10-CM | POA: Diagnosis not present

## 2021-04-12 MED ORDER — LIDOCAINE HCL (PF) 2 % IJ SOLN
10.0000 mL | Freq: Once | INTRAMUSCULAR | Status: AC
  Administered 2021-04-12: 10 mL

## 2021-04-12 NOTE — Sedation Documentation (Signed)
PT tolerated thyroid biopsy procedure well today. Labs obtained and sent for pathology. PT ambulatory at discharge with no acute distress noted and verbalized understanding of discharge instructions. 

## 2021-04-13 ENCOUNTER — Encounter: Payer: Self-pay | Admitting: Orthopedic Surgery

## 2021-04-13 ENCOUNTER — Ambulatory Visit (INDEPENDENT_AMBULATORY_CARE_PROVIDER_SITE_OTHER): Payer: 59 | Admitting: Orthopedic Surgery

## 2021-04-13 ENCOUNTER — Other Ambulatory Visit: Payer: Self-pay

## 2021-04-13 DIAGNOSIS — M20022 Boutonniere deformity of left finger(s): Secondary | ICD-10-CM

## 2021-04-13 DIAGNOSIS — M20021 Boutonniere deformity of right finger(s): Secondary | ICD-10-CM | POA: Diagnosis not present

## 2021-04-13 LAB — CYTOLOGY - NON PAP

## 2021-04-13 NOTE — Progress Notes (Signed)
   Office Visit Note   Patient: Harold Rasmussen           Date of Birth: April 26, 2001           MRN: [id number] Visit Date: 04/13/2021              Requested by: Mechele Claude, MD [address] [place],  [place] [address] PCP: Mechele Claude, MD  Chief Complaint  Patient presents with   Other     Right ring finger injury 1 month ago      HPI: Patient is a 20 year old gentleman who is seen for initial evaluation for his right ring finger.  Patient states he had a traumatic injury about a month ago he is right-hand dominant he states that this is gotten better he was told he initially had a fracture.  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  His right ring finger has healed, no intervention for the boutonniere deformities.  Follow-Up Instructions: Return if symptoms worsen or fail to improve.   Ortho Exam  Patient is alert, oriented, no adenopathy, well-dressed, normal affect, normal respiratory effort. On examination patient has full range of motion of the fingers on both hands.  He does have a boutonniere deformity of the right ring finger which is consistent with the boutonniere deformities on the left hand as well.  Patient's finger has no varus or valgus laxity he has no tenderness to palpation no swelling.  Imaging: No results found. No images are attached to the encounter.  Labs: No results found for: HGBA1C, ESRSEDRATE, CRP, LABURIC, REPTSTATUS, GRAMSTAIN, CULT, LABORGA   Lab Results  Component Value Date   ALBUMIN 4.4 06/27/2018    No results found for: MG No results found for: VD25OH  No results found for: PREALBUMIN CBC EXTENDED Latest Ref Rng & Units 06/27/2018  WBC 3.4 - 10.8 x10E3/uL 4.9  RBC 4.14 - 5.80 x10E6/uL 4.57  HGB 13.0 - 17.7 g/dL 98.3  HCT 38.2 - 50.5 % 40.4  PLT 150 - 450 x10E3/uL 216  NEUTROABS 1.4 - 7.0 x10E3/uL  2.4  LYMPHSABS 0.7 - 3.1 x10E3/uL 1.6     There is no height or weight on file to calculate BMI.  Orders:  No orders of the defined types were placed in this encounter.  No orders of the defined types were placed in this encounter.    Procedures: No procedures performed  Clinical Data: No additional findings.  ROS:  All other systems negative, except as noted in the HPI. Review of Systems  Objective: Vital Signs: There were no vitals taken for this visit.  Specialty Comments:  No specialty comments available.  PMFS History: Patient Active Problem List   Diagnosis Date Noted   Other atopic dermatitis 10/18/2020   Bilateral impacted cerumen 08/05/2020   History reviewed. No pertinent past medical history.  History reviewed. No pertinent family history.  History reviewed. No pertinent surgical history. Social History   Occupational History   Not on file  Tobacco Use   Smoking status: Never   Smokeless tobacco: Never  Vaping Use   Vaping Use: Never used  Substance and Sexual Activity   Alcohol use: No   Drug use: Never   Sexual activity: Yes    Birth control/protection: Condom

## 2021-05-09 ENCOUNTER — Encounter (HOSPITAL_COMMUNITY): Payer: Self-pay

## 2021-05-19 ENCOUNTER — Encounter (HOSPITAL_COMMUNITY): Payer: Self-pay

## 2021-10-25 IMAGING — CT CT HEAD W/O CM
3 series · 14 of 47 positions shown, 16 images · non-contrast
Comparison: None.

CLINICAL DATA: Tripped and fell going into house, struck head on
cement steps, intoxicated, headache and neck stiffness/pain

EXAM:
CT HEAD WITHOUT CONTRAST
CT CERVICAL SPINE WITHOUT CONTRAST
TECHNIQUE: Multidetector CT imaging of the head and cervical spine was
performed following the standard protocol without intravenous
contrast. Multiplanar CT image reconstructions of the cervical spine
were also generated.

[Series 2: head w o · axial · 0.46mm/px · z∈[+13,+158]mm · 8 of 35 slices shown, 10 images]
[im 3/35  brain]
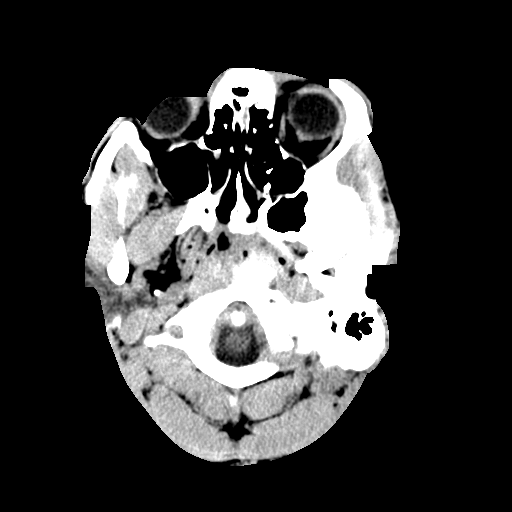
[im 3/35  bone]
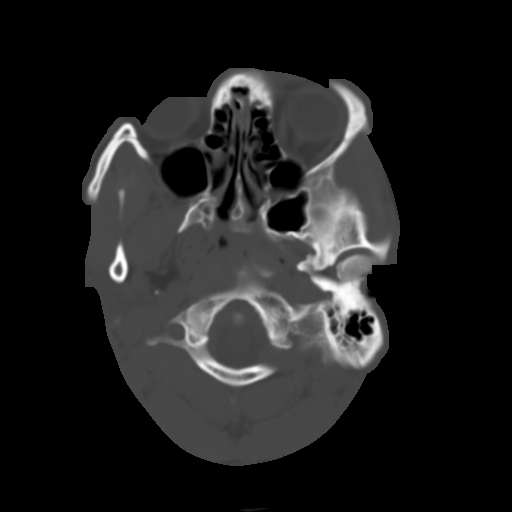
[im 8/35  brain]
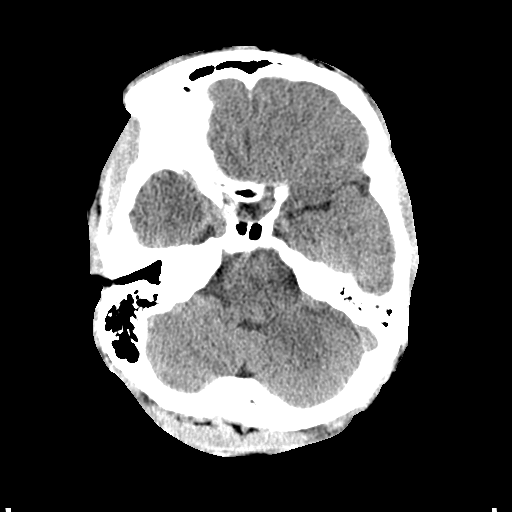
[im 11/35  brain]
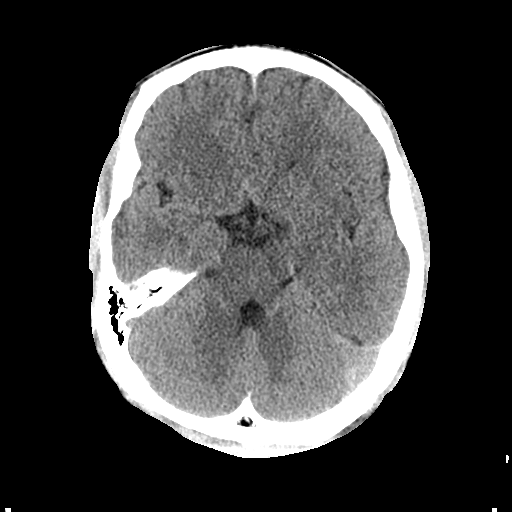
[im 16/35  brain]
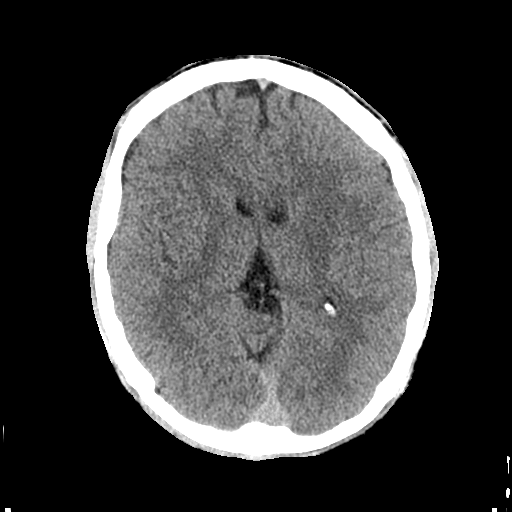
[im 19/35  brain]
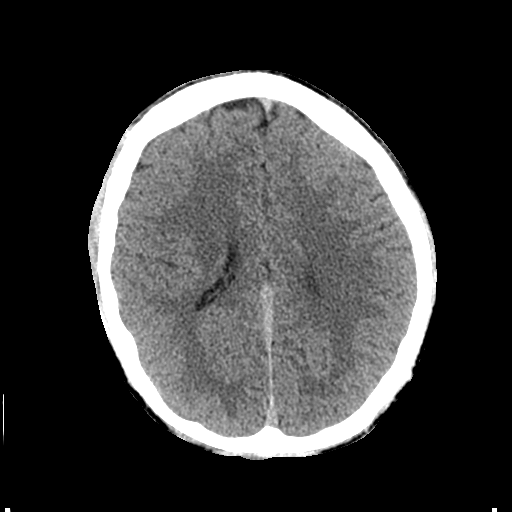
[im 19/35  bone]
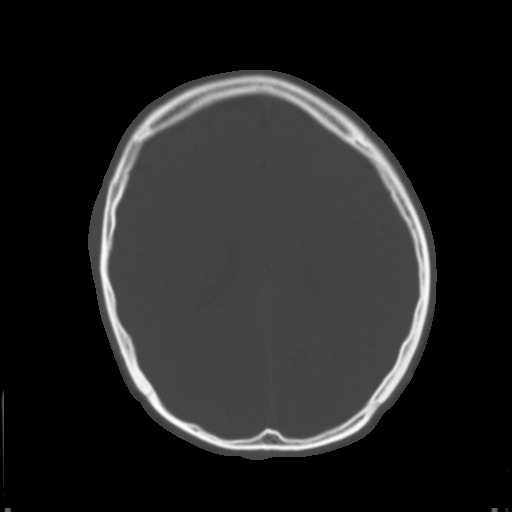
[im 24/35  brain]
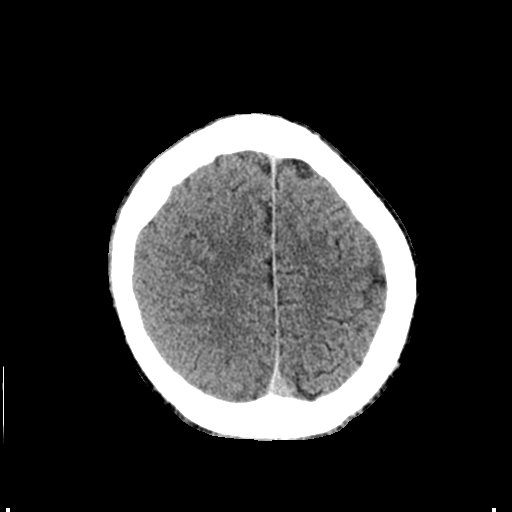
[im 27/35  brain]
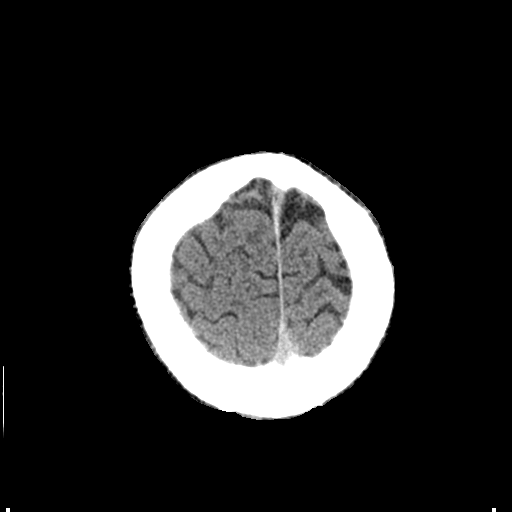
[im 32/35  brain]
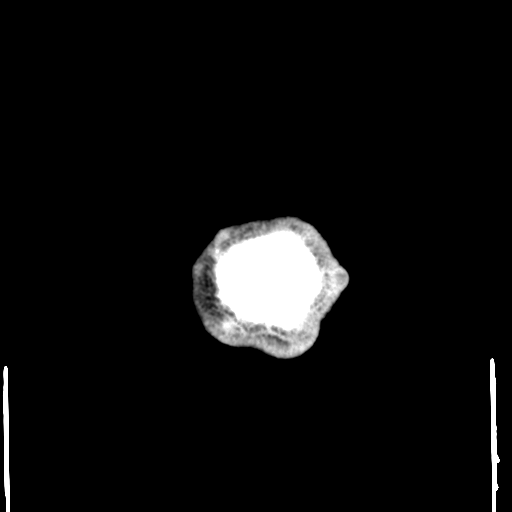

[Series 4: coronal soft · coronal · 0.38mm/px · 3 of 74 slices shown]
[im 25/74  brain]
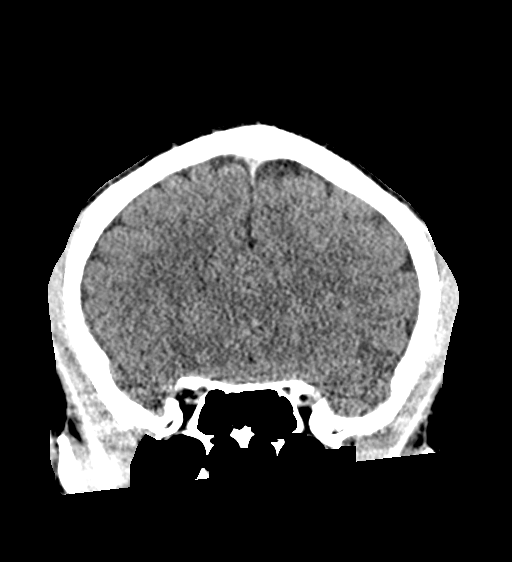
[im 33/74  brain]
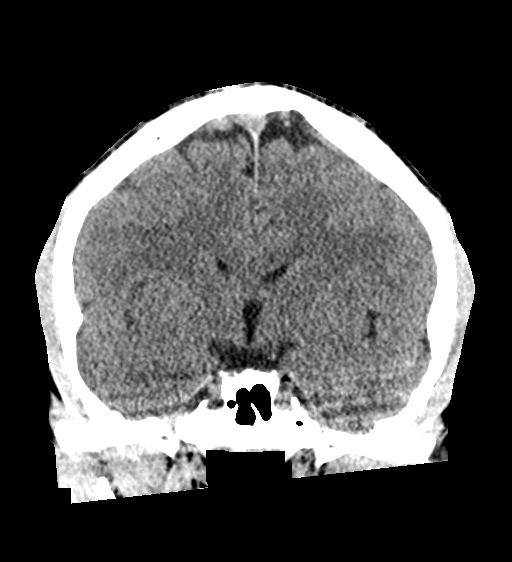
[im 41/74  brain]
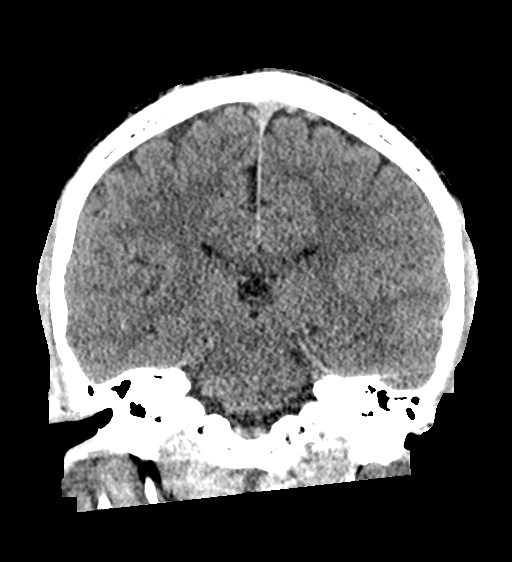

[Series 5: sagittal soft · sagittal · 0.40mm/px · 3 of 61 slices shown]
[im 25/61  brain]
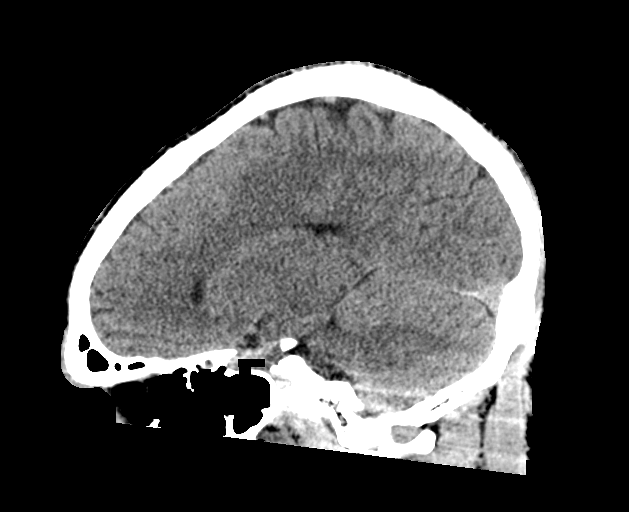
[im 31/61  brain]
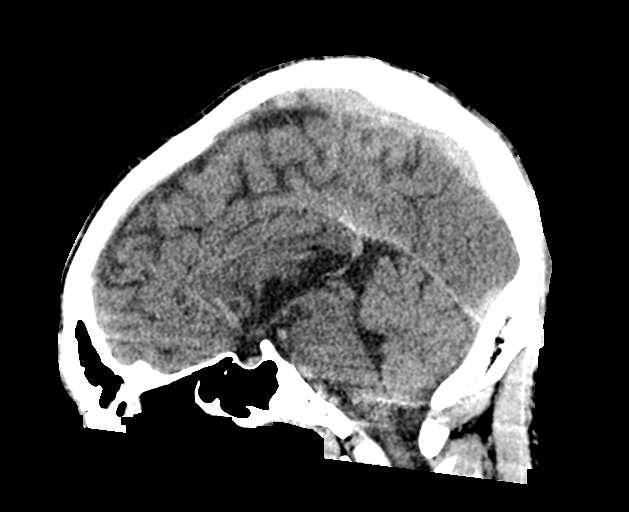
[im 36/61  brain]
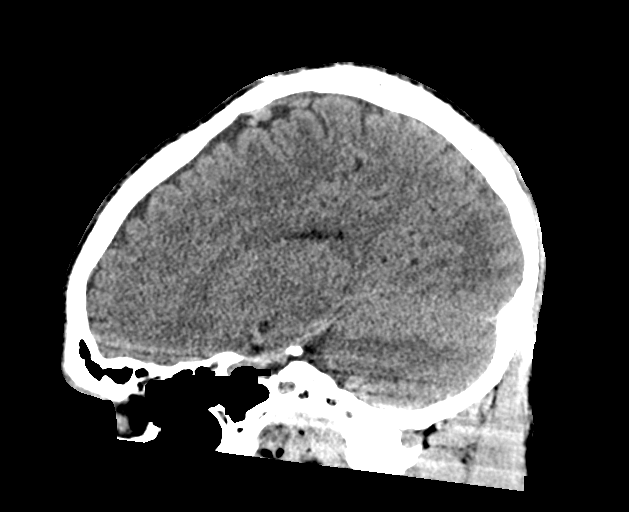

[14 of 47 positions shown; findings below may reference images not displayed]

FINDINGS: CT HEAD FINDINGS

Brain: No evidence of acute infarction, hemorrhage, hydrocephalus,
extra-axial collection, visible mass lesion or mass effect.

Vascular: No hyperdense vessel or unexpected calcification.

Skull: Right frontal midline scalp laceration at the glabella with
cutaneous defect. Additional laceration across the superior nasal
bridge with fractures of the bilateral nasal bones and angulated
segmental fracture of the bony nasal septum. No calvarial fracture.
No other acute osseous abnormalities. Multiple sites of soft tissue
thickening of the posterior dermal scalp with a larger ovoid
cutaneous nodule in the posterior midline parietal scalp measuring
39 Hounsfield units with a size of 1.8 x 1.2 cm in maximum
transaxial dimension.

Sinuses/Orbits: Nasal bone fractures as above, only partially
included within the level of imaging. Minimal thickening in the
anterior ethmoids and nasal passages. Remaining paranasal sinuses
are predominantly clear. Mastoid air cells are clear. Middle ear
cavities are clear. Debris noted in the bilateral external auditory
canals. Bilateral periorbital stranding, swelling predominately
along the medial orbits adjacent nasal bone fractures. No retro
septal gas, stranding or hemorrhage. Globes are unremarkable.
Remaining retro septal contents are free of acute abnormality.

Other: None

CT CERVICAL SPINE FINDINGS

Alignment: Slight reversal the normal cervical lordosis likely
accentuated by flexion despite the presence of a stabilization
collar. No evidence of traumatic listhesis. No abnormally widened,
perched or jumped facets. Normal alignment of the craniocervical and
atlantoaxial articulations.

Skull base and vertebrae: No acute skull base fracture. No vertebral
body fracture or height loss. Normal bone mineralization. No
worrisome osseous lesions.

Soft tissues and spinal canal: No pre or paravertebral fluid or
swelling. No visible canal hematoma.

Disc levels: No significant central canal or foraminal stenosis
identified within the imaged levels of the spine.

Upper chest: No acute abnormality in the upper chest or imaged lung
apices.

Other: 2 cm hypoattenuating nodule in the left thyroid gland with
some punctate calcification along the periphery.
IMPRESSION: 1. No acute intracranial abnormality.
2. Frontal midline scalp laceration at the glabella with cutaneous
defect. No calvarial fracture.
3. Additional laceration across the superior nasal bridge with
fractures of the bilateral nasal bones and angulated segmental
fracture of the bony nasal septum only partially included within the
imaging. Correlate with clinical findings and if there is concern
for more extensive facial bone injuries, maxillofacial CT could be
obtained.
4. Bilateral periorbital stranding, swelling predominately along the
medial orbits adjacent nasal bone fractures. No globe injury, retro
septal gas, stranding or hemorrhage.
5. No acute fracture or traumatic listhesis of the cervical spine.
6. Debris in the bilateral external auditory canals, correlate for
cerumen impaction.
7. 2 cm hypoattenuating nodule in the left thyroid gland with some
punctate calcification along the periphery. Recommend thyroid US
(ref: [HOSPITAL]. [DATE]): 143-50).
8. Multiple sites of soft tissue thickening of the posterior dermal
scalp with a larger ovoid cutaneous nodule in the posterior midline
parietal scalp measuring 39 Hounsfield units with a size of 1.8 x
1.2 cm in maximum transaxial dimension. Correlate with visual
inspection for the presence of dermal inclusion cysts other
cutaneous processes, unlikely to be acute.

## 2022-04-03 ENCOUNTER — Telehealth: Payer: 59

## 2022-07-04 ENCOUNTER — Encounter: Payer: Self-pay | Admitting: Family Medicine

## 2022-07-04 ENCOUNTER — Ambulatory Visit (INDEPENDENT_AMBULATORY_CARE_PROVIDER_SITE_OTHER): Payer: 59 | Admitting: Family Medicine

## 2022-07-04 VITALS — BP 123/72 | HR 56 | Temp 98.2°F | Ht 74.0 in | Wt 189.1 lb

## 2022-07-04 DIAGNOSIS — L309 Dermatitis, unspecified: Secondary | ICD-10-CM | POA: Diagnosis not present

## 2022-07-04 MED ORDER — TRIAMCINOLONE ACETONIDE 0.1 % EX CREA: 1.0000 | TOPICAL_CREAM | Freq: Two times a day (BID) | CUTANEOUS | 1 refills | Status: DC

## 2022-07-04 NOTE — Progress Notes (Signed)
   Acute Office Visit  Subjective:     Patient ID: Harold Rasmussen, male    DOB: Feb 27, 2001, 21 y.o.   MRN: [id number]  Chief Complaint  Patient presents with   Rash    Rash   Patient is in today for a rash to his upper back and chest. This has been present for a few weeks. It is itchy, dry, and scaly. Denies exudate, warmth, or changes in rash. He has been using cocoa butter without improvement. He has a history of eczema.   Review of Systems  Skin:  Positive for rash.   As per HPI.      Objective:    BP 123/72   Pulse (!) 56   Temp 98.2 F (36.8 C) (Temporal)   Ht 6\' 2"  (1.88 m)   Wt 189 lb 2 oz (85.8 kg)   SpO2 100%   BMI 24.28 kg/m    Physical Exam Vitals and nursing note reviewed.  Constitutional:      General: He is not in acute distress.    Appearance: He is not ill-appearing, toxic-appearing or diaphoretic.  Pulmonary:     Effort: Pulmonary effort is normal. No respiratory distress.  Skin:    General: Skin is warm and dry.     Findings: Rash (dry, scaly rash to bilateral upper back and anterior chest) present.  Neurological:     General: No focal deficit present.     Mental Status: He is alert and oriented to person, place, and time.  Psychiatric:        Mood and Affect: Mood normal.     No results found for any visits on 07/04/22.      Assessment & Plan:   Phelix was seen today for rash.  Diagnoses and all orders for this visit:  Eczema, unspecified type Kenalog BID as below. Handout given.  -     triamcinolone cream (KENALOG) 0.1 %; Apply 1 Application topically 2 (two) times daily.   Return if symptoms worsen or fail to improve.  The patient indicates understanding of these issues and agrees with the plan.  Gwenlyn Perking, FNP

## 2022-07-04 NOTE — Patient Instructions (Signed)
Eczema Eczema refers to a group of skin conditions that cause skin to become rough and inflamed. Each type of eczema has different triggers, symptoms, and treatments. Eczema of any type is usually itchy. Symptoms range from mild to severe. Eczema is not spread from person to person (is not contagious). It can appear on different parts of the body at different times. One person's eczema may look different from another person's eczema. What are the causes? The exact cause of this condition is not known. However, exposure to certain environmental factors, irritants, and allergens can make the condition worse. What are the signs or symptoms? Symptoms of this condition depend on the type of eczema you have. The types include: Contact dermatitis. There are two kinds: Irritant contact dermatitis. This happens when something irritates the skin and causes a rash. Allergic contact dermatitis. This happens when your skin comes in contact with something you are allergic to (allergens). This can include poison ivy, chemicals, or medicines that were applied to your skin. Atopic dermatitis. This is a long-term (chronic) skin disease that keeps coming back (recurring). It is the most common type of eczema. Usual symptoms are a red rash and itchy, dry, scaly skin. It usually starts showing signs in infancy and can last through adulthood. Dyshidrotic eczema. This is a form of eczema on the hands and feet. It shows up as very itchy, fluid-filled blisters. It can affect people of any age but is more common before age 40. Hand eczema. This causes very itchy areas of skin on the palms and sides of the hands and fingers. This type of eczema is common in industrial jobs where you may be exposed to different types of irritants. Lichen simplex chronicus. This type of eczema occurs when a person constantly scratches one area of the body. Repeated scratching of the area leads to thickened skin (lichenification). This condition can  accompany other types of eczema. It is more common in adults but may also be seen in children. Nummular eczema. This is a common type of eczema that most often affects the lower legs and the backs of the hands. It typically causes an itchy, red, circular, crusty lesion (plaque). Scratching may become a habit and can cause bleeding. Nummular eczema occurs most often in middle-aged or older people. Seborrheic dermatitis. This is a common skin disease that mainly affects the scalp. It may also affect other oily areas of the body, such as the face, sides of the nose, eyebrows, ears, eyelids, and chest. It is marked by small scaling and redness of the skin (erythema). This can affect people of all ages. In infants, this condition is called cradle cap. Stasis dermatitis. This is a common skin disease that can cause itching, scaling, and hyperpigmentation, usually on the legs and feet. It occurs most often in people who have a condition that prevents blood from being pumped through the veins in the legs (chronic venous insufficiency). Stasis dermatitis is a chronic condition that needs long-term management. How is this diagnosed? This condition may be diagnosed based on: A physical exam of your skin. Your medical history. Skin patch tests. These tests involve using patches that contain possible allergens and placing them on your back. Your health care provider will check in a few days to see if an allergic reaction occurred. How is this treated? Treatment for eczema is based on the type of eczema you have. You may be given hydrocortisone steroid medicine or antihistamines. These can relieve itching quickly and help reduce inflammation.   These may be prescribed or purchased over the counter, depending on the strength that is needed. Follow these instructions at home: Take or apply over-the-counter and prescription medicines only as told by your health care provider. Use creams or ointments to moisturize your  skin. Do not use lotions. Learn what triggers or irritates your symptoms so you can avoid these things. Treat symptom flare-ups quickly. Do not scratch your skin. This can make your rash worse. Keep all follow-up visits. This is important. Where to find more information American Academy of Dermatology: aad.org National Eczema Association: nationaleczema.org The Society for Pediatric Dermatology: pedsderm.net Contact a health care provider if: You have severe itching, even with treatment. You scratch your skin regularly until it bleeds. Your rash looks different than usual. Your skin is painful, swollen, or more red than usual. You have a fever. Summary Eczema refers to a group of skin conditions that cause skin to become rough and inflamed. Each type has different triggers. Eczema of any type causes itching that may range from mild to severe. Treatment varies based on the type of eczema you have. Hydrocortisone steroid medicine or antihistamines can help with itching and inflammation. Protecting your skin is the best way to prevent eczema. Use creams or ointments to moisturize your skin. Avoid triggers and irritants. Treat flare-ups quickly. This information is not intended to replace advice given to you by your health care provider. Make sure you discuss any questions you have with your health care provider. Document Revised: 07/04/2020 Document Reviewed: 07/04/2020 Elsevier Patient Education  2023 Elsevier Inc.  

## 2022-09-13 ENCOUNTER — Encounter: Payer: Self-pay | Admitting: Family Medicine

## 2022-09-13 ENCOUNTER — Ambulatory Visit: Payer: Self-pay | Admitting: Nurse Practitioner

## 2022-09-14 ENCOUNTER — Encounter: Payer: Self-pay | Admitting: Family Medicine

## 2022-09-14 ENCOUNTER — Ambulatory Visit (INDEPENDENT_AMBULATORY_CARE_PROVIDER_SITE_OTHER): Payer: BC Managed Care – PPO | Admitting: Family Medicine

## 2022-09-14 ENCOUNTER — Ambulatory Visit: Payer: BC Managed Care – PPO | Admitting: Family Medicine

## 2022-09-14 VITALS — BP 132/78 | HR 75 | Temp 97.9°F | Ht 74.0 in | Wt 190.0 lb

## 2022-09-14 DIAGNOSIS — L2089 Other atopic dermatitis: Secondary | ICD-10-CM

## 2022-09-14 DIAGNOSIS — L309 Dermatitis, unspecified: Secondary | ICD-10-CM

## 2022-09-14 DIAGNOSIS — Z113 Encounter for screening for infections with a predominantly sexual mode of transmission: Secondary | ICD-10-CM | POA: Diagnosis not present

## 2022-09-14 MED ORDER — PREDNISONE 20 MG PO TABS: ORAL_TABLET | ORAL | 0 refills | Status: DC

## 2022-09-14 NOTE — Progress Notes (Signed)
BP 132/78   Pulse 75   Temp 97.9 F (36.6 C)   Ht 6\' 2"  (1.88 m)   Wt 190 lb (86.2 kg)   SpO2 96%   BMI 24.39 kg/m    Subjective:   Patient ID: Harold Rasmussen, male    DOB: 2001-03-21, 21 y.o.   MRN: [id number]  HPI: Harold Rasmussen is a 21 y.o. male presenting on 09/14/2022 for Rash (Bilateral hands and chest. TMC does not help. Thinks that he may have allergy to latex)   HPI Rash on chest and back and now hands. Patient has had eczema being diagnosed with eczema on his chest and his back and his neck taking triamcinolone for that it does help some, also uses cocoa butter and it does help some but now he has developed this irritation on his hands and is concerned about a latex allergy versus reaction to chemicals used at work but does not know for sure.  He has not used the triamcinolone or moisturizers on his hands at this point.  Patient has been sexually active and wants to do STD testing.  Denies any symptoms  Relevant past medical, surgical, family and social history reviewed and updated as indicated. Interim medical history since our last visit reviewed. Allergies and medications reviewed and updated.  Review of Systems  Constitutional:  Negative for chills and fever.  Respiratory:  Negative for shortness of breath and wheezing.   Cardiovascular:  Negative for chest pain and leg swelling.  Skin:  Positive for rash.  All other systems reviewed and are negative.   Per HPI unless specifically indicated above   Allergies as of 09/14/2022   No Known Allergies      Medication List        Accurate as of September 14, 2022 11:28 AM. If you have any questions, ask your nurse or doctor.          STOP taking these medications    fexofenadine 180 MG tablet Commonly known as: ALLEGRA Stopped by: September 16, 2022 Sky Borboa, MD       TAKE these medications    predniSONE 20 MG tablet Commonly known as: DELTASONE 2 po at same time daily for 5 days Started by:  Elige Radon Hermenia Fritcher, MD   triamcinolone cream 0.1 % Commonly known as: KENALOG Apply 1 Application topically 2 (two) times daily.         Objective:   BP 132/78   Pulse 75   Temp 97.9 F (36.6 C)   Ht 6\' 2"  (1.88 m)   Wt 190 lb (86.2 kg)   SpO2 96%   BMI 24.39 kg/m   Wt Readings from Last 3 Encounters:  09/14/22 190 lb (86.2 kg)  07/04/22 189 lb 2 oz (85.8 kg)  03/22/21 194 lb (88 kg)    Physical Exam Vitals and nursing note reviewed.  Constitutional:      Appearance: Normal appearance.  Skin:    Findings: Rash (Including chest upper torso and on the dorsum of both hands.  No erythema or drainage) present.  Neurological:     Mental Status: He is alert.       Assessment & Plan:   Problem List Items Addressed This Visit       Musculoskeletal and Integument   Other atopic dermatitis   Other Visit Diagnoses     Eczema, unspecified type    -  Primary   Relevant Medications   predniSONE (DELTASONE) 20 MG tablet   Screening for STD (  sexually transmitted disease)       Relevant Orders   RPR   HepB+HepC+HIV Panel   HSV(herpes simplex vrs) 1+2 ab-IgG   Urine cytology ancillary only     Will do short burst of prednisone along with triamcinolone to help.  His hands it could be atopic dermatitis and eczema or could be irritation from contact dermatitis with something that is centered reaction to, he will test different types of gloves at work.  Follow up plan: Return if symptoms worsen or fail to improve.  Counseling provided for all of the vaccine components Orders Placed This Encounter  Procedures   RPR   HepB+HepC+HIV Panel   HSV(herpes simplex vrs) 1+2 ab-IgG    Arville Care, MD [facility] 09/14/2022, 11:28 AM

## 2022-10-12 ENCOUNTER — Other Ambulatory Visit: Payer: BC Managed Care – PPO

## 2023-02-12 ENCOUNTER — Other Ambulatory Visit: Payer: Self-pay | Admitting: Family Medicine

## 2023-02-15 ENCOUNTER — Other Ambulatory Visit: Payer: Self-pay | Admitting: Family Medicine

## 2023-02-15 ENCOUNTER — Telehealth: Payer: Self-pay | Admitting: Family Medicine

## 2023-02-15 NOTE — Telephone Encounter (Signed)
  Prescription Request  02/15/2023  Is this a "Controlled Substance" medicine? NO  Have you seen your PCP in the last 2 weeks? No pt has appt on 03/19/23  If YES, route message to pool  -  If NO, patient needs to be scheduled for appointment.  What is the name of the medication or equipment? Med Name: FEXOFENADINE HCL 180 MG TABLET]   Have you contacted your pharmacy to request a refill? yes   Which pharmacy would you like this sent to? [facility]   Patient notified that their request is being sent to the clinical staff for review and that they should receive a response within 2 business days.

## 2023-02-18 ENCOUNTER — Other Ambulatory Visit: Payer: Self-pay | Admitting: Family Medicine

## 2023-02-18 MED ORDER — FEXOFENADINE HCL 180 MG PO TABS: 180.0000 mg | ORAL_TABLET | Freq: Every day | ORAL | 1 refills | Status: DC

## 2023-02-18 NOTE — Telephone Encounter (Signed)
Please let the patient know that I sent their prescription to their pharmacy. Thanks, WS 

## 2023-03-05 ENCOUNTER — Encounter: Payer: Self-pay | Admitting: Nurse Practitioner

## 2023-03-05 ENCOUNTER — Ambulatory Visit: Payer: BC Managed Care – PPO | Admitting: Nurse Practitioner

## 2023-03-05 VITALS — BP 131/81 | HR 72 | Temp 97.5°F | Ht 74.0 in | Wt 194.6 lb

## 2023-03-05 DIAGNOSIS — H1032 Unspecified acute conjunctivitis, left eye: Secondary | ICD-10-CM

## 2023-03-05 MED ORDER — POLYMYXIN B-TRIMETHOPRIM 10000-0.1 UNIT/ML-% OP SOLN: 1.0000 [drp] | OPHTHALMIC | 0 refills | Status: DC

## 2023-03-05 NOTE — Progress Notes (Signed)
   Acute Office Visit  Subjective:     Patient ID: Harold Rasmussen, male    DOB: 2001-05-12, 22 y.o.   MRN: [id number]  Chief Complaint  Patient presents with   Conjunctivitis    Started Saturday. Woke up Sunday morning left eye was stuck together. Used pink eye eye drops used 2-3x day. Helped some not as red.    Conjunctivitis    SUBJECTIVE:  22 y.o. male with burning, redness, discharge and mattering in left eye for 4 days.  No other symptoms.  No significant prior ophthalmological history. No change in visual acuity, no photophobia, no severe eye pain.   ROS Negative unless indicated in HPI    Objective:    BP 131/81   Pulse 72   Temp (!) 97.5 F (36.4 C) (Temporal)   Ht 6\' 2"  (1.88 m)   Wt 194 lb 9.6 oz (88.3 kg)   SpO2 97%   BMI 24.99 kg/m  BP Readings from Last 3 Encounters:  03/05/23 131/81  09/14/22 132/78  07/04/22 123/72   OBJECTIVE:  Patient appears well, vitals signs are normal. Eyes: left eye with findings of typical conjunctivitis noted; erythema and discharge. PERRLA, no foreign body noted. No periorbital cellulitis. The corneas are clear and fundi normal. Visual acuity normal.      Assessment & Plan:  Acute bacterial conjunctivitis of left eye  Other orders -     Polymyxin B-Trimethoprim; Place 1 drop into the left eye every 4 (four) hours.  Dispense: 10 mL; Refill: 0    ASSESSMENT:  Conjunctivitis - probably bacterial Left eye conjunctivitis Start Polymixin - okay to use warm compress - Work note provided  PLAN:  Antibiotic drops per order. Hygiene discussed. If other family members develop same condition, may use same medication for them if they are not known to be allergic to it. Call prn.  Return if symptoms worsen or fail to improve, for follow-up.  Arrie Aran Santa Lighter, NP

## 2023-03-07 ENCOUNTER — Other Ambulatory Visit: Payer: Self-pay | Admitting: *Deleted

## 2023-03-07 ENCOUNTER — Telehealth: Payer: Self-pay | Admitting: Family Medicine

## 2023-03-07 MED ORDER — CETIRIZINE HCL 10 MG PO TABS: 10.0000 mg | ORAL_TABLET | Freq: Every day | ORAL | 11 refills | Status: AC

## 2023-03-07 NOTE — Telephone Encounter (Signed)
Noted and added to med list. 

## 2023-03-07 NOTE — Telephone Encounter (Signed)
Pt says that he does not take fexofenadine (ALLEGRA) 180 MG tablet for allegries. He says that he takes cetrizine.

## 2023-03-19 ENCOUNTER — Ambulatory Visit: Payer: BC Managed Care – PPO | Admitting: Family Medicine

## 2023-03-19 ENCOUNTER — Encounter: Payer: Self-pay | Admitting: Family Medicine

## 2023-03-19 VITALS — BP 123/83 | HR 60 | Temp 97.6°F | Ht 74.0 in | Wt 193.4 lb

## 2023-03-19 DIAGNOSIS — Z0000 Encounter for general adult medical examination without abnormal findings: Secondary | ICD-10-CM | POA: Diagnosis not present

## 2023-03-19 NOTE — Progress Notes (Signed)
Subjective:  Patient ID: Harold Rasmussen, male    DOB: [birth year]  Age: 22 y.o. MRN: [id number]  CC: Follow-up and Conjunctivitis   HPI [name] presents for check up since he hasn't been in for 2 years. Also recent conjunctivitis cleared in 3 days. Dced med  Left eye is now back to normal . No redness, pain or visual deficit.     03/19/2023    1:05 PM 09/14/2022   10:55 AM 07/04/2022    4:50 PM  Depression screen PHQ 2/9  Decreased Interest 0 0 0  Down, Depressed, Hopeless 0 0 0  PHQ - 2 Score 0 0 0  Altered sleeping  1 0  Tired, decreased energy  1 0  Change in appetite  0 0  Feeling bad or failure about yourself   0 0  Trouble concentrating  0 0  Moving slowly or fidgety/restless  0 0  Suicidal thoughts  0 0  PHQ-9 Score  2 0  Difficult doing work/chores  Somewhat difficult Not difficult at all    History Harold Rasmussen has no past medical history on file.   He has no past surgical history on file.   His family history is not on file.He reports that he has never smoked. He has never used smokeless tobacco. He reports that he does not drink alcohol and does not use drugs.    ROS Review of Systems  Constitutional: Negative.   HENT: Negative.    Eyes:  Negative for visual disturbance.  Respiratory:  Negative for cough and shortness of breath.   Cardiovascular:  Negative for chest pain and leg swelling.  Gastrointestinal:  Negative for abdominal pain, diarrhea, nausea and vomiting.  Genitourinary:  Negative for difficulty urinating.  Musculoskeletal:  Negative for arthralgias and myalgias.  Skin:  Negative for rash.  Neurological:  Negative for headaches.  Psychiatric/Behavioral:  Negative for sleep disturbance.     Objective:  BP 123/83   Pulse 60   Temp 97.6 F (36.4 C)   Ht 6\' 2"  (1.88 m)   Wt 193 lb 6.4 oz (87.7 kg)   SpO2 98%   BMI 24.83 kg/m   BP Readings from Last 3 Encounters:  03/19/23 123/83  03/05/23 131/81  09/14/22 132/78    Wt  Readings from Last 3 Encounters:  03/19/23 193 lb 6.4 oz (87.7 kg)  03/05/23 194 lb 9.6 oz (88.3 kg)  09/14/22 190 lb (86.2 kg)     Physical Exam Constitutional:      General: He is not in acute distress.    Appearance: He is well-developed.  HENT:     Head: Normocephalic and atraumatic.     Right Ear: External ear normal.     Left Ear: External ear normal.     Nose: Nose normal.  Eyes:     Conjunctiva/sclera: Conjunctivae normal.     Pupils: Pupils are equal, round, and reactive to light.  Cardiovascular:     Rate and Rhythm: Normal rate and regular rhythm.     Heart sounds: Normal heart sounds. No murmur heard. Pulmonary:     Effort: Pulmonary effort is normal. No respiratory distress.     Breath sounds: Normal breath sounds. No wheezing or rales.  Abdominal:     Palpations: Abdomen is soft.     Tenderness: There is no abdominal tenderness.  Musculoskeletal:        General: Normal range of motion.     Cervical back: Normal range of motion and neck supple.  Skin:    General: Skin is warm and dry.  Neurological:     Mental Status: He is alert and oriented to person, place, and time.     Deep Tendon Reflexes: Reflexes are normal and symmetric.  Psychiatric:        Behavior: Behavior normal.        Thought Content: Thought content normal.        Judgment: Judgment normal.       Assessment & Plan:   Owyn was seen today for follow-up and conjunctivitis.  Diagnoses and all orders for this visit:  Well adult exam -     CBC with Differential/Platelet -     CMP14+EGFR -     Lipid panel       I have discontinued Jayse Narayan's triamcinolone cream, predniSONE, fexofenadine, and trimethoprim-polymyxin b. I am also having him maintain his cetirizine.  Allergies as of 03/19/2023   No Known Allergies      Medication List        Accurate as of March 19, 2023  3:52 PM. If you have any questions, ask your nurse or doctor.          STOP taking  these medications    fexofenadine 180 MG tablet Commonly known as: ALLEGRA Stopped by: Mechele Claude, MD   predniSONE 20 MG tablet Commonly known as: DELTASONE Stopped by: Mechele Claude, MD   triamcinolone cream 0.1 % Commonly known as: KENALOG Stopped by: Mechele Claude, MD   trimethoprim-polymyxin b ophthalmic solution Commonly known as: POLYTRIM Stopped by: Mechele Claude, MD       TAKE these medications    cetirizine 10 MG tablet Commonly known as: ZYRTEC Take 1 tablet (10 mg total) by mouth daily.         Follow-up: Return in about 1 year (around 03/18/2024) for [facility] physical.  Mechele Claude, M.D.

## 2023-03-20 LAB — CMP14+EGFR
ALT: 31 IU/L (ref 0–44)
AST: 28 IU/L (ref 0–40)
Albumin/Globulin Ratio: 1.4
Albumin: 4.4 g/dL (ref 4.3–5.2)
Alkaline Phosphatase: 93 IU/L (ref 44–121)
BUN/Creatinine Ratio: 13 (ref 9–20)
BUN: 14 mg/dL (ref 6–20)
Bilirubin Total: 0.9 mg/dL (ref 0.0–1.2)
CO2: 28 mmol/L (ref 20–29)
Calcium: 9.8 mg/dL (ref 8.7–10.2)
Chloride: 100 mmol/L (ref 96–106)
Creatinine, Ser: 1.07 mg/dL (ref 0.76–1.27)
Globulin, Total: 3.1 g/dL (ref 1.5–4.5)
Glucose: 86 mg/dL (ref 70–99)
Potassium: 4.3 mmol/L (ref 3.5–5.2)
Sodium: 140 mmol/L (ref 134–144)
Total Protein: 7.5 g/dL (ref 6.0–8.5)
eGFR: 100 mL/min/{1.73_m2} (ref >59)

## 2023-03-20 LAB — CBC WITH DIFFERENTIAL/PLATELET
Basophils Absolute: 0 10*3/uL (ref 0.0–0.2)
Basos: 1 %
EOS (ABSOLUTE): 0.3 10*3/uL (ref 0.0–0.4)
Eos: 6 %
Hematocrit: 43.6 % (ref 37.5–51.0)
Hemoglobin: 14.9 g/dL (ref 13.0–17.7)
Immature Grans (Abs): 0 10*3/uL (ref 0.0–0.1)
Immature Granulocytes: 0 %
Lymphocytes Absolute: 1.6 10*3/uL (ref 0.7–3.1)
Lymphs: 34 %
MCH: 31 pg (ref 26.6–33.0)
MCHC: 34.2 g/dL (ref 31.5–35.7)
MCV: 91 fL (ref 79–97)
Monocytes Absolute: 0.5 10*3/uL (ref 0.1–0.9)
Monocytes: 12 %
Neutrophils Absolute: 2.1 10*3/uL (ref 1.4–7.0)
Neutrophils: 47 %
Platelets: 201 10*3/uL (ref 150–450)
RBC: 4.8 x10E6/uL (ref 4.14–5.80)
RDW: 13.2 % (ref 11.6–15.4)
WBC: 4.5 10*3/uL (ref 3.4–10.8)

## 2023-03-20 LAB — LIPID PANEL
Chol/HDL Ratio: 3 ratio (ref 0.0–5.0)
Cholesterol, Total: 191 mg/dL (ref 100–199)
HDL: 64 mg/dL (ref >39)
LDL Chol Calc (NIH): 116 mg/dL — ABNORMAL HIGH (ref 0–99)
Triglycerides: 60 mg/dL (ref 0–149)
VLDL Cholesterol Cal: 11 mg/dL (ref 5–40)

## 2023-03-22 ENCOUNTER — Other Ambulatory Visit: Payer: Self-pay

## 2023-03-22 ENCOUNTER — Telehealth: Payer: Self-pay | Admitting: Family Medicine

## 2023-03-22 DIAGNOSIS — R899 Unspecified abnormal finding in specimens from other organs, systems and tissues: Secondary | ICD-10-CM

## 2023-03-22 NOTE — Telephone Encounter (Signed)
lmtcb

## 2023-03-26 NOTE — Telephone Encounter (Signed)
PATIENT AWARE, VIEWED LABS ON [facility]

## 2023-07-11 ENCOUNTER — Ambulatory Visit: Payer: BC Managed Care – PPO | Admitting: Family Medicine

## 2023-07-15 ENCOUNTER — Encounter: Payer: Self-pay | Admitting: Family Medicine

## 2023-07-19 ENCOUNTER — Ambulatory Visit: Payer: BC Managed Care – PPO | Admitting: Family Medicine

## 2023-08-21 ENCOUNTER — Ambulatory Visit (INDEPENDENT_AMBULATORY_CARE_PROVIDER_SITE_OTHER): Payer: BC Managed Care – PPO | Admitting: Family Medicine

## 2023-08-21 ENCOUNTER — Encounter: Payer: Self-pay | Admitting: Family Medicine

## 2023-08-21 DIAGNOSIS — R899 Unspecified abnormal finding in specimens from other organs, systems and tissues: Secondary | ICD-10-CM

## 2023-08-21 DIAGNOSIS — Z202 Contact with and (suspected) exposure to infections with a predominantly sexual mode of transmission: Secondary | ICD-10-CM | POA: Diagnosis not present

## 2023-08-21 MED ORDER — AZITHROMYCIN 250 MG PO TABS: ORAL_TABLET | ORAL | 0 refills | Status: DC

## 2023-08-21 NOTE — Patient Instructions (Signed)
Use debrox drops daily in the left ear for 1 week to clear wax

## 2023-08-21 NOTE — Progress Notes (Signed)
   Subjective:  Patient ID: Harold Rasmussen, male    DOB: July 25, 2001  Age: 22 y.o. MRN: [id number]  CC: No chief complaint on file.   HPI Nikolaj Deerwester presents for exposure to Chlamydia. Wants STD testing.      03/19/2023    1:05 PM 09/14/2022   10:55 AM 07/04/2022    4:50 PM  Depression screen PHQ 2/9  Decreased Interest 0 0 0  Down, Depressed, Hopeless 0 0 0  PHQ - 2 Score 0 0 0  Altered sleeping  1 0  Tired, decreased energy  1 0  Change in appetite  0 0  Feeling bad or failure about yourself   0 0  Trouble concentrating  0 0  Moving slowly or fidgety/restless  0 0  Suicidal thoughts  0 0  PHQ-9 Score  2 0  Difficult doing work/chores  Somewhat difficult Not difficult at all    History Tex has no past medical history on file.   He has no past surgical history on file.   His family history is not on file.He reports that he has never smoked. He has never used smokeless tobacco. He reports that he does not drink alcohol and does not use drugs.    ROS Review of Systems  Objective:  There were no vitals taken for this visit.  BP Readings from Last 3 Encounters:  03/19/23 123/83  03/05/23 131/81  09/14/22 132/78    Wt Readings from Last 3 Encounters:  03/19/23 193 lb 6.4 oz (87.7 kg)  03/05/23 194 lb 9.6 oz (88.3 kg)  09/14/22 190 lb (86.2 kg)     Physical Exam    Assessment & Plan:   Diagnoses and all orders for this visit:  Abnormal laboratory test result  Possible exposure to STD -     Chlamydia/Gonococcus/Trichomonas, NAA -     HSV(herpes simplex vrs) 1+2 ab-IgG -     HepB+HepC+HIV Panel -     RPR  Chlamydia contact, untreated -     HSV(herpes simplex vrs) 1+2 ab-IgG -     HepB+HepC+HIV Panel -     RPR  Other orders -     azithromycin (ZITHROMAX) 250 MG tablet; Take all as a single dose       I am having Khole Bruschi start on azithromycin. I am also having him maintain his cetirizine.  Allergies as of 08/21/2023    No Known Allergies      Medication List        Accurate as of August 21, 2023  3:06 PM. If you have any questions, ask your nurse or doctor.          azithromycin 250 MG tablet Commonly known as: ZITHROMAX Take all as a single dose Started by: Thelonious Kauffmann   cetirizine 10 MG tablet Commonly known as: ZYRTEC Take 1 tablet (10 mg total) by mouth daily.         Follow-up: Return if symptoms worsen or fail to improve.  Mechele Claude, M.D.

## 2023-08-22 LAB — HEPB+HEPC+HIV PANEL
HIV Screen 4th Generation wRfx: NONREACTIVE
Hep B C IgM: NEGATIVE
Hep B Core Total Ab: NEGATIVE
Hep B E Ab: NONREACTIVE
Hep B E Ag: NEGATIVE
Hep B Surface Ab, Qual: NONREACTIVE
Hep C Virus Ab: NONREACTIVE
Hepatitis B Surface Ag: NEGATIVE

## 2023-08-22 LAB — RPR: RPR Ser Ql: NONREACTIVE

## 2023-08-22 LAB — HSV 1 AND 2 AB, IGG
HSV 1 Glycoprotein G Ab, IgG: NONREACTIVE
HSV 2 IgG, Type Spec: NONREACTIVE

## 2023-08-23 ENCOUNTER — Other Ambulatory Visit: Payer: Self-pay | Admitting: Family Medicine

## 2023-08-23 LAB — CT, NG, MYCOPLASMAS NAA, URINE
Chlamydia trachomatis, NAA: POSITIVE — AB
Mycoplasma genitalium NAA: NEGATIVE
Mycoplasma hominis NAA: NEGATIVE
Neisseria gonorrhoeae, NAA: POSITIVE — AB
Ureaplasma spp NAA: POSITIVE — AB

## 2023-08-23 MED ORDER — DOXYCYCLINE HYCLATE 100 MG PO CAPS: 100.0000 mg | ORAL_CAPSULE | Freq: Two times a day (BID) | ORAL | 0 refills | Status: DC

## 2023-08-27 MED ORDER — CEFTRIAXONE SODIUM 500 MG IJ SOLR
500.0000 mg | Freq: Once | INTRAMUSCULAR | Status: AC
  Administered 2023-09-23: 500 mg via INTRAMUSCULAR

## 2023-08-27 NOTE — Addendum Note (Signed)
Addended by: Adella Hare B on: 08/27/2023 05:02 PM   Modules accepted: Orders

## 2023-09-03 ENCOUNTER — Ambulatory Visit: Payer: BC Managed Care – PPO

## 2023-09-04 ENCOUNTER — Telehealth: Payer: Self-pay

## 2023-09-04 ENCOUNTER — Ambulatory Visit: Payer: BC Managed Care – PPO | Admitting: Family Medicine

## 2023-09-04 NOTE — Telephone Encounter (Signed)
Attempted to call pt regarding his apt with [place] today  He was just seen for this on 11/13   Need to know if he has been re exposed or having new symptoms   Also need to know what he was exposed to and if it was male or male   Please ask pt these questions if he calls back so we can better assist at his apt

## 2023-09-04 NOTE — Telephone Encounter (Signed)
Patient cancelled appointment for today and rescheduled for next week.

## 2023-09-10 ENCOUNTER — Telehealth: Payer: Self-pay

## 2023-09-10 ENCOUNTER — Ambulatory Visit: Payer: BC Managed Care – PPO | Admitting: Nurse Practitioner

## 2023-09-10 NOTE — Telephone Encounter (Signed)
Lmtcb for pt to reschedule appt for today due to provider being out sick

## 2023-09-17 ENCOUNTER — Ambulatory Visit: Payer: BC Managed Care – PPO | Admitting: Family Medicine

## 2023-09-18 ENCOUNTER — Telehealth: Payer: Self-pay | Admitting: Family Medicine

## 2023-09-18 NOTE — Telephone Encounter (Signed)
Copied from CRM [id number]. Topic: General - Other >> Sep 18, 2023 11:07 AM Larwance Sachs wrote: Reason for CRM: Erin Fulling communical disease nurse on behalf os [place]  called in regarding speaking to a nurse about patient Left call back number as [phone]

## 2023-09-18 NOTE — Telephone Encounter (Signed)
I spoke to [place] from Communicable Disease regarding pt not coming in for appt to have his Rocephin injection and advised pt has an appt 12/16 so she states she will reach out to him to reiterate the importance of keeping appt for treatment.

## 2023-09-23 ENCOUNTER — Ambulatory Visit (INDEPENDENT_AMBULATORY_CARE_PROVIDER_SITE_OTHER): Payer: BC Managed Care – PPO | Admitting: Family Medicine

## 2023-09-23 ENCOUNTER — Encounter: Payer: Self-pay | Admitting: Family Medicine

## 2023-09-23 VITALS — BP 114/69 | HR 60 | Temp 97.9°F | Ht 74.0 in | Wt 199.0 lb

## 2023-09-23 DIAGNOSIS — R899 Unspecified abnormal finding in specimens from other organs, systems and tissues: Secondary | ICD-10-CM | POA: Diagnosis not present

## 2023-09-23 DIAGNOSIS — E785 Hyperlipidemia, unspecified: Secondary | ICD-10-CM

## 2023-09-23 DIAGNOSIS — M25512 Pain in left shoulder: Secondary | ICD-10-CM

## 2023-09-23 DIAGNOSIS — Z202 Contact with and (suspected) exposure to infections with a predominantly sexual mode of transmission: Secondary | ICD-10-CM | POA: Diagnosis not present

## 2023-09-23 MED ORDER — DICLOFENAC SODIUM 75 MG PO TBEC: 75.0000 mg | DELAYED_RELEASE_TABLET | Freq: Two times a day (BID) | ORAL | 0 refills | Status: AC

## 2023-09-23 NOTE — Patient Instructions (Signed)
Shoulder Range of Motion Exercises Shoulder range of motion (ROM) exercises are done to keep the shoulder moving freely or to increase movement. They are recommended for people who have shoulder pain or stiffness or who are recovering from a shoulder surgery. Ask your health care provider which exercises are safe for you. Do exercises exactly as told by your health care provider and adjust them as directed. It is normal to feel mild stretching, pulling, tightness, or discomfort as you do these exercises. Stop right away if you feel sudden pain or your pain gets worse. Do not begin these exercises until told by your health care provider. Phase 1 exercise When you are able, do this exercise 1-2 times a day for 30-60 seconds in each direction, or as directed by your health care provider. Pendulum exercise     To do this exercise while sitting: Sit in a chair or at the edge of your bed with your feet flat on the floor. Let your affected arm hang down in front of you over the edge of the bed or chair. Relax your shoulder, arm, and hand. Rock your body so your arm gently swings in small circles. You can also use your unaffected arm to start the motion. Repeat, changing the direction of the circles, swinging your arm left and right, and swinging your arm forward and back. To do this exercise while standing: Stand next to a sturdy chair or table, and hold on to it with your hand on your unaffected side. Bend forward at the waist. Bend your knees slightly. Relax your shoulder, arm, and hand. While keeping your shoulder relaxed, use body motion to swing your arm in small circles. Repeat, changing the direction of the circles, swinging your arm left and right, and swinging your arm forward and back. Between exercises, stand up tall and take a short break to relax your lower back.  Phase 2 exercises Do these exercises 1-2 times a day or as told by your health care provider. Hold each stretch for 30  seconds, and repeat 3 times. Do the exercises with one or both arms as instructed by your health care provider. For these exercises, sit at a table with your hand and arm supported by the table. A chair that slides easily or has wheels can be helpful. External rotation

## 2023-09-23 NOTE — Progress Notes (Signed)
Subjective:  Patient ID: Harold Rasmussen, male    DOB: [birth year]  Age: 22 y.o. MRN: [id number]  CC: STD FOLLOW UP   HPI Harvard Roti presents for left shoulder sore when he lays on that side. In to get his Ceftriaxone injection due to triple STD GC/chlamydia/ Ureaplasm infection. Already treated with azithro & doxy. He also needs his lipids checked.      09/23/2023    8:35 AM 03/19/2023    1:05 PM 09/14/2022   10:55 AM  Depression screen PHQ 2/9  Decreased Interest 0 0 0  Down, Depressed, Hopeless 0 0 0  PHQ - 2 Score 0 0 0  Altered sleeping   1  Tired, decreased energy   1  Change in appetite   0  Feeling bad or failure about yourself    0  Trouble concentrating   0  Moving slowly or fidgety/restless   0  Suicidal thoughts   0  PHQ-9 Score   2  Difficult doing work/chores   Somewhat difficult    History Donathan has no past medical history on file.   He has no past surgical history on file.   His family history is not on file.He reports that he has never smoked. He has never used smokeless tobacco. He reports that he does not drink alcohol and does not use drugs.    ROS Review of Systems  Constitutional:  Negative for fever.  Respiratory:  Negative for shortness of breath.   Cardiovascular:  Negative for chest pain.  Musculoskeletal:  Positive for arthralgias (left shoulder).  Skin:  Negative for rash.    Objective:  BP 114/69   Pulse 60   Temp 97.9 F (36.6 C)   Ht 6\' 2"  (1.88 m)   Wt 199 lb (90.3 kg)   SpO2 100%   BMI 25.55 kg/m   BP Readings from Last 3 Encounters:  09/23/23 114/69  03/19/23 123/83  03/05/23 131/81    Wt Readings from Last 3 Encounters:  09/23/23 199 lb (90.3 kg)  03/19/23 193 lb 6.4 oz (87.7 kg)  03/05/23 194 lb 9.6 oz (88.3 kg)     Physical Exam Vitals reviewed.  Constitutional:      Appearance: He is well-developed.  HENT:     Head: Normocephalic and atraumatic.     Right Ear: External ear normal.     Left  Ear: External ear normal.     Mouth/Throat:     Pharynx: No oropharyngeal exudate or posterior oropharyngeal erythema.  Eyes:     Pupils: Pupils are equal, round, and reactive to light.  Cardiovascular:     Rate and Rhythm: Normal rate and regular rhythm.     Heart sounds: No murmur heard. Pulmonary:     Effort: No respiratory distress.     Breath sounds: Normal breath sounds.  Musculoskeletal:        General: Tenderness (left AC) present. Normal range of motion.     Cervical back: Normal range of motion and neck supple.  Neurological:     Mental Status: He is alert and oriented to person, place, and time.       Assessment & Plan:   Orvill was seen today for std follow up.  Diagnoses and all orders for this visit:  Hyperlipidemia, unspecified hyperlipidemia type -     Lipid panel  Possible exposure to STD -     Ct Ng M genitalium NAA, Urine; Future -     Mycoplasma / ureaplasma  culture; Future -     Ct Ng M genitalium NAA, Urine  Acute pain of left shoulder  Other orders -     diclofenac (VOLTAREN) 75 MG EC tablet; Take 1 tablet (75 mg total) by mouth 2 (two) times daily. For muscle and  Joint pain       I have discontinued Averill Polimeni's azithromycin and doxycycline. I am also having him start on diclofenac. Additionally, I am having him maintain his cetirizine. We administered cefTRIAXone.  Allergies as of 09/23/2023   No Known Allergies      Medication List        Accurate as of September 23, 2023 10:20 AM. If you have any questions, ask your nurse or doctor.          STOP taking these medications    azithromycin 250 MG tablet Commonly known as: ZITHROMAX Stopped by: Remijio Holleran   doxycycline 100 MG capsule Commonly known as: Vibramycin Stopped by: Jaylei Fuerte       TAKE these medications    cetirizine 10 MG tablet Commonly known as: ZYRTEC Take 1 tablet (10 mg total) by mouth daily.   diclofenac 75 MG EC tablet Commonly  known as: VOLTAREN Take 1 tablet (75 mg total) by mouth 2 (two) times daily. For muscle and  Joint pain Started by: Broadus John Kojo Liby         Follow-up: Return in about 6 months (around 03/23/2024).  Mechele Claude, M.D.

## 2023-09-24 LAB — LIPID PANEL
Chol/HDL Ratio: 3.1 {ratio} (ref 0.0–5.0)
Cholesterol, Total: 180 mg/dL (ref 100–199)
HDL: 58 mg/dL (ref >39)
LDL Chol Calc (NIH): 105 mg/dL — ABNORMAL HIGH (ref 0–99)
Triglycerides: 96 mg/dL (ref 0–149)
VLDL Cholesterol Cal: 17 mg/dL (ref 5–40)

## 2023-09-25 ENCOUNTER — Other Ambulatory Visit: Payer: Self-pay | Admitting: Family Medicine

## 2023-09-25 LAB — CT NG M GENITALIUM NAA, URINE
Chlamydia trachomatis, NAA: NEGATIVE
Neisseria gonorrhoeae, NAA: NEGATIVE

## 2023-09-25 MED ORDER — MOXIFLOXACIN HCL 400 MG PO TABS: 400.0000 mg | ORAL_TABLET | Freq: Every day | ORAL | 0 refills | Status: DC

## 2023-09-25 MED ORDER — DOXYCYCLINE HYCLATE 100 MG PO CAPS: 100.0000 mg | ORAL_CAPSULE | Freq: Two times a day (BID) | ORAL | 0 refills | Status: DC

## 2023-10-07 ENCOUNTER — Ambulatory Visit (INDEPENDENT_AMBULATORY_CARE_PROVIDER_SITE_OTHER): Payer: BC Managed Care – PPO | Admitting: Family Medicine

## 2023-10-07 ENCOUNTER — Encounter: Payer: Self-pay | Admitting: Family Medicine

## 2023-10-07 VITALS — BP 138/88 | HR 60 | Temp 97.5°F | Ht 74.0 in | Wt 198.2 lb

## 2023-10-07 DIAGNOSIS — A493 Mycoplasma infection, unspecified site: Secondary | ICD-10-CM | POA: Diagnosis not present

## 2023-10-07 NOTE — Progress Notes (Signed)
Chief Complaint  Patient presents with   Follow-up    HPI  Patient presents today for recheck of his STDs.  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  He says he plans to get those today.  PMH: Smoking status noted ROS: Per HPI  Objective: BP 138/88   Pulse 60   Temp (!) 97.5 F (36.4 C)   Ht 6\' 2"  (1.88 m)   Wt 198 lb 3.2 oz (89.9 kg)   SpO2 98%   BMI 25.45 kg/m  Gen: NAD, alert, cooperative with exam HEENT: NCAT, EOMI, PERRL CV: RRR, good S1/S2, no murmur Resp: CTABL, no wheezes, non-labored  Assessment and plan:  1. 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  He should be abstinent until he finishes the treatment with both drugs.  He understands to use the doxycycline first and when it is finished to take the moxifloxacin.  He is supposed to take them for a week each.  Patient verbalizes understanding. Follow-up in 6 months for complete physical and as needed.  Condoms for all sexual activity.  Abstinence and monogamy are the only absolute preventive for STD.   Mechele Claude, MD

## 2023-11-12 ENCOUNTER — Telehealth: Payer: Self-pay | Admitting: Family Medicine

## 2023-11-12 NOTE — Telephone Encounter (Signed)
 Copied from CRM [id number]. Topic: Clinical - Medical Advice >> Nov 12, 2023 10:59 AM Bascom RAMAN wrote: Reason for CRM: Patient has an ingrown hair on lower abdomen. Can you advise on how to remove it without making an appointment? Callback number is [phone]

## 2023-11-13 NOTE — Telephone Encounter (Signed)
 Left vm for cb - sent mychart message also

## 2023-11-20 ENCOUNTER — Encounter: Payer: Self-pay | Admitting: Nurse Practitioner

## 2023-11-20 ENCOUNTER — Ambulatory Visit (INDEPENDENT_AMBULATORY_CARE_PROVIDER_SITE_OTHER): Payer: BC Managed Care – PPO | Admitting: Nurse Practitioner

## 2023-11-20 VITALS — BP 125/79 | HR 54 | Temp 97.1°F | Wt 204.0 lb

## 2023-11-20 DIAGNOSIS — L0292 Furuncle, unspecified: Secondary | ICD-10-CM | POA: Insufficient documentation

## 2023-11-20 DIAGNOSIS — L02229 Furuncle of trunk, unspecified: Secondary | ICD-10-CM

## 2023-11-20 MED ORDER — SULFAMETHOXAZOLE-TRIMETHOPRIM 800-160 MG PO TABS: 1.0000 | ORAL_TABLET | Freq: Two times a day (BID) | ORAL | 0 refills | Status: AC

## 2023-11-20 NOTE — Progress Notes (Signed)
Acute Office Visit  Subjective:     Patient ID: Harold Rasmussen, male    DOB: Feb 18, 2001, 23 y.o.   MRN: [id number]  Chief Complaint  Patient presents with   ingrown hair    Pt noticed it last week and it has since gotten bigger and is now painful.     HPI Harold Rasmussen 23 year old male seen today November 20, 2023 for an acute visit concern for folliculitis Deep folliculitis 23 year old male presents with a painful, deep folliculitis that has developed into an abscess in the right inguinal area. The lesion has been present for some time and is painful to the touch. The patient denies any fever or systemic symptoms. He has not tried any over-the-counter medications for the condition. The plan is to identify the abscess and send a culture to guide treatment.  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  HENT:  Negative for congestion and sore throat.   Respiratory:  Negative for cough, shortness of breath and wheezing.   Cardiovascular:  Negative for chest pain and leg swelling.  Gastrointestinal:  Negative for diarrhea, nausea and vomiting.  Genitourinary:  Negative for frequency, hematuria and urgency.  Skin:  Negative for rash.       Pain hair follicle on right inguinal area  Neurological:  Negative for dizziness and headaches.   Negative unless indicated in HPI    Objective:    BP 125/79   Pulse (!) 54   Temp (!) 97.1 F (36.2 C)   Wt 204 lb (92.5 kg)   SpO2 100%   BMI 26.19 kg/m  BP Readings from Last 3 Encounters:  11/20/23 125/79  10/07/23 138/88  09/23/23 114/69   Wt  Readings from Last 3 Encounters:  11/20/23 204 lb (92.5 kg)  10/07/23 198 lb 3.2 oz (89.9 kg)  09/23/23 199 lb (90.3 kg)      Physical Exam Vitals and nursing note reviewed.  Constitutional:      General: He is not in acute distress.    Appearance: Normal appearance.  HENT:     Head: Normocephalic and atraumatic.     Nose: Nose normal.     Mouth/Throat:     Mouth: Mucous membranes are moist.  Eyes:     General: No scleral icterus.    Extraocular Movements: Extraocular movements intact.     Conjunctiva/sclera: Conjunctivae normal.     Pupils: Pupils are equal, round, and reactive to light.  Cardiovascular:     Heart sounds: Normal heart sounds.  Pulmonary:     Effort: Pulmonary effort is normal.     Breath sounds: Normal breath sounds.  Musculoskeletal:        General: Normal range of motion.     Right lower leg: No edema.     Left lower leg: No edema.  Skin:    General: Skin is warm.     Findings: Abscess present.       Neurological:     Mental Status: He is alert and oriented to person, place, and time.     No results found for any visits on 11/20/23.  Assessment & Plan:  Deep folliculitis -     Anaerobic and Aerobic Culture -     Sulfamethoxazole-Trimethoprim; Take 1 tablet by mouth 2 (two) times daily for 5 days.  Dispense: 10 tablet; Refill: 0  Other orders -     I & D    Harold Rasmussen 23-year-old male seen today for deep folliculitis, no acute distress  will treat with Bactrim twice daily for 5 days in order urine culture, client understand based on wound culture result no antibiotic may be needed I & D  Date/Time: 11/20/2023 1:12 PM  Performed by: Martina Sinner, NP Authorized by: Martina Sinner, NP   Consent:    Consent obtained:  Verbal   Consent given by:  Patient   Risks, benefits, and alternatives were discussed: yes     Risks discussed:  Bleeding, infection and pain   Alternatives discussed:  No  treatment Location:    Type:  Abscess Pre-procedure details:    Skin preparation:  Povidone-iodine Sedation:    Sedation type:  None Anesthesia:    Anesthesia method:  None (3 ml of lidocaine with epi) Procedure type:    Complexity:  Simple Procedure details:    Needle aspiration: no     Incision types:  Single straight   Incision depth:  Dermal   Scalpel blade:  10   Wound management:  Irrigated with saline   Drainage:  Serosanguinous   Drainage amount:  Scant   Wound treatment:  Wound left open   Packing materials:  1/2 in gauze Post-procedure details:    Procedure completion:  Tolerated well, no immediate complications   Encourage healthy lifestyle choices, including diet (rich in fruits, vegetables, and lean proteins, and low in salt and simple carbohydrates) and exercise (at least 30 minutes of moderate physical activity daily).     The above assessment and management plan was discussed with the patient. The patient verbalized understanding of and has agreed to the management plan. Patient is aware to call the clinic if they develop any new symptoms or if symptoms persist or worsen. Patient is aware when to return to the clinic for a follow-up visit. Patient educated on when it is appropriate to go to the emergency department.  Return if symptoms worsen or fail to improve.  Harold Rasmussen Santa Lighter, [facility] [address] [place], [place] [address] [phone]  Note: This document was prepared by [employer]ubin Milan voice dictation technology and any errors that results from this process are unintentional.

## 2023-11-25 LAB — ANAEROBIC AND AEROBIC CULTURE

## 2023-11-26 ENCOUNTER — Telehealth: Payer: Self-pay

## 2023-11-26 NOTE — Telephone Encounter (Signed)
 Copied from CRM [id number]. Topic: General - Other >> Nov 26, 2023  3:51 PM Ivette P wrote: Reason for CRM: Pt was returning call from office, no documentation, Called [facility] and was immediately put on hold, Pt disconnected call. Pls contact pt back

## 2023-11-27 NOTE — Telephone Encounter (Signed)
 lmtcb

## 2024-02-18 ENCOUNTER — Emergency Department (HOSPITAL_COMMUNITY)
Admission: EM | Admit: 2024-02-18 | Discharge: 2024-02-18 | Disposition: A | Discharge status: Home / Self Care | Attending: Emergency Medicine | Admitting: Emergency Medicine

## 2024-02-18 ENCOUNTER — Other Ambulatory Visit: Payer: Self-pay

## 2024-02-18 ENCOUNTER — Encounter (HOSPITAL_COMMUNITY): Payer: Self-pay | Admitting: Emergency Medicine

## 2024-02-18 DIAGNOSIS — Z23 Encounter for immunization: Secondary | ICD-10-CM | POA: Diagnosis not present

## 2024-02-18 DIAGNOSIS — L03116 Cellulitis of left lower limb: Secondary | ICD-10-CM | POA: Diagnosis present

## 2024-02-18 MED ORDER — CEFTRIAXONE SODIUM 1 G IJ SOLR
1.0000 g | Freq: Once | INTRAMUSCULAR | Status: AC
  Administered 2024-02-18: 1 g via INTRAMUSCULAR
  Filled 2024-02-18: qty 10

## 2024-02-18 MED ORDER — STERILE WATER FOR INJECTION IJ SOLN
INTRAMUSCULAR | Status: AC
  Filled 2024-02-18: qty 10

## 2024-02-18 MED ORDER — DOXYCYCLINE HYCLATE 100 MG PO CAPS: 100.0000 mg | ORAL_CAPSULE | Freq: Two times a day (BID) | ORAL | 0 refills | Status: AC

## 2024-02-18 MED ORDER — TETANUS-DIPHTH-ACELL PERTUSSIS 5-2.5-18.5 LF-MCG/0.5 IM SUSY
0.5000 mL | PREFILLED_SYRINGE | Freq: Once | INTRAMUSCULAR | Status: AC
  Administered 2024-02-18: 0.5 mL via INTRAMUSCULAR
  Filled 2024-02-18: qty 0.5

## 2024-02-18 NOTE — Discharge Instructions (Signed)
 Follow-up with your family doctor the end of the week for recheck

## 2024-02-18 NOTE — ED Triage Notes (Signed)
 Pt to ed pov c/o insect bite to left proximal shin roughly 2-3 days ago. Pt thought it was an ingrown hair and "popped" the bump. Bump area is red, tender, and calf is swollen. Pt states it is painful to walk on and the pain makes it difficult to sleep. NAD

## 2024-02-18 NOTE — ED Provider Notes (Signed)
  [place] EMERGENCY DEPARTMENT AT [facility] Provider Note   CSN: [id number] Arrival date & time: 02/18/24  2106     History {Add pertinent medical, surgical, social history, OB history to HPI:1} Chief Complaint  Patient presents with   Insect Bite    Harold Rasmussen is a 23 y.o. male.  Patient states that he thought maybe he was bit on the left leg by a bug because he has swelling and tenderness in his left lower leg.   Rash      Home Medications Prior to Admission medications   Medication Sig Start Date End Date Taking? Authorizing Provider  doxycycline  (VIBRAMYCIN ) 100 MG capsule Take 1 capsule (100 mg total) by mouth 2 (two) times daily. One po bid x 7 days 02/18/24  Yes Dallin Mccorkel, MD  cetirizine  (ZYRTEC ) 10 MG tablet Take 1 tablet (10 mg total) by mouth daily. 03/07/23   Roise Cleaver, MD  diclofenac  (VOLTAREN ) 75 MG EC tablet Take 1 tablet (75 mg total) by mouth 2 (two) times daily. For muscle and  Joint pain 09/23/23   Roise Cleaver, MD      Allergies    Patient has no known allergies.    Review of Systems   Review of Systems  Skin:  Positive for rash.    Physical Exam Updated Vital Signs BP 134/67 (BP Location: Right Arm)   Pulse 79   Temp 98.1 F (36.7 C) (Oral)   Resp 14   Ht 6\' 3"  (1.905 m)   Wt 93.3 kg   SpO2 99%   BMI 25.70 kg/m  Physical Exam  ED Results / Procedures / Treatments   Labs (all labs ordered are listed, but only abnormal results are displayed) Labs Reviewed - No data to display  EKG None  Radiology No results found.  Procedures Procedures  {Document cardiac monitor, telemetry assessment procedure when appropriate:1}  Medications Ordered in ED Medications  cefTRIAXone  (ROCEPHIN ) injection 1 g (has no administration in time range)  Tdap (BOOSTRIX) injection 0.5 mL (has no administration in time range)    ED Course/ Medical Decision Making/ A&P   {   Click here for ABCD2, HEART and other  calculatorsREFRESH Note before signing :1}                              Medical Decision Making Risk Prescription drug management.   Patient with a cellulitis to the left lower leg.  He is given Rocephin  and doxycycline  and will follow-up with PCP  {Document critical care time when appropriate:1} {Document review of labs and clinical decision tools ie heart score, Chads2Vasc2 etc:1}  {Document your independent review of radiology images, and any outside records:1} {Document your discussion with family members, caretakers, and with consultants:1} {Document social determinants of health affecting pt's care:1} {Document your decision making why or why not admission, treatments were needed:1} Final Clinical Impression(s) / ED Diagnoses Final diagnoses:  Cellulitis of left leg    Rx / DC Orders ED Discharge Orders          Ordered    doxycycline  (VIBRAMYCIN ) 100 MG capsule  2 times daily        02/18/24 2135

## 2024-02-26 ENCOUNTER — Encounter: Payer: Self-pay | Admitting: Nurse Practitioner

## 2024-02-26 ENCOUNTER — Ambulatory Visit: Admitting: Nurse Practitioner

## 2024-02-26 VITALS — BP 123/66 | HR 61 | Temp 97.7°F | Ht 75.0 in | Wt 206.2 lb

## 2024-02-26 DIAGNOSIS — T63301D Toxic effect of unspecified spider venom, accidental (unintentional), subsequent encounter: Secondary | ICD-10-CM | POA: Diagnosis not present

## 2024-02-26 DIAGNOSIS — T63301A Toxic effect of unspecified spider venom, accidental (unintentional), initial encounter: Secondary | ICD-10-CM | POA: Insufficient documentation

## 2024-02-26 MED ORDER — TRIPLE ANTIBIOTIC 3.5-400-5000 EX OINT: 1.0000 | TOPICAL_OINTMENT | Freq: Two times a day (BID) | CUTANEOUS | 0 refills | Status: AC

## 2024-02-26 NOTE — Progress Notes (Signed)
 Acute Office Visit  Subjective:     Patient ID: Harold Rasmussen, male    DOB: [birth year], 23 y.o.   MRN: [id number]  Chief Complaint  Patient presents with   Insect Bite    2-3 weeks ago had bump on left leg, popped it, became infected went to er was told it was a spider bite given antibiotics did not finish them spider bite still oozing     HPI Harold Rasmussen is a 23 year old male presenting on 02/26/2024 for an acute visit due to concerns about a suspected spider bite. The patient was evaluated in the emergency department (ED) on 02/18/2024, where he was prescribed doxycycline  for 14 days, which he is currently taking as directed. He reports that he continues to clean the wound daily with hydrogen peroxide and notes that the lesion remains open. He denies any associated systemic symptoms such as fever, chills, fatigue, or malaise. No reported increase in redness, swelling, or drainage.  Due to the persistence of the open lesion, a wound culture has been ordered to assess for possible secondary infection or antibiotic resistance.  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  Respiratory:  Negative for cough, shortness of breath and wheezing.   Cardiovascular:  Negative for chest pain and leg swelling.  Skin:  Negative for itching.       Wound on anterior tibia  Neurological:  Negative for dizziness and headaches.   Negative unless indicated in HPI    Objective:    BP 123/66   Pulse 61   Temp 97.7 F (36.5 C) (Temporal)   Ht 6\' 3"  (1.905 m)   Wt 206 lb 3.2 oz (93.5 kg)   SpO2 97%    BMI 25.77 kg/m  BP Readings from Last 3 Encounters:  02/26/24 123/66  02/18/24 134/67  11/20/23 125/79   Wt Readings from Last 3 Encounters:  02/26/24 206 lb 3.2 oz (93.5 kg)  02/18/24 205 lb 9.6 oz (93.3 kg)  11/20/23 204 lb (92.5 kg)      Physical Exam Vitals and nursing note reviewed.  Constitutional:      General: He is not in acute distress. HENT:     Head: Normocephalic and atraumatic.     Nose: Nose normal.  Eyes:     General: No scleral icterus.    Extraocular Movements: Extraocular movements intact.     Conjunctiva/sclera: Conjunctivae normal.     Pupils: Pupils are equal, round, and reactive to light.  Cardiovascular:     Heart sounds: Normal heart sounds.  Pulmonary:     Effort: Pulmonary effort is normal.     Breath sounds: Normal breath sounds.  Skin:    Findings: Wound present.  Neurological:     Mental Status: He is alert and oriented to person, place, and time.  Psychiatric:        Mood and Affect: Mood normal.        Behavior: Behavior normal.        Thought Content: Thought content normal.        Judgment: Judgment normal.  No results found for any visits on 02/26/24.      Assessment & Plan:  Spider bite wound, accidental or unintentional, initial encounter -     Triple Antibiotic; Apply 1 Application topically in the morning and at bedtime.  Dispense: 15 g; Refill: 0 -     Anaerobic and Aerobic Culture   Harold Rasmussen is a 23 yrs old [place] male seen today fro spider bite Wound culture collected results pending, Finish the course of ATB prescribed at the ED  Encourage healthy lifestyle choices, including diet (rich in fruits, vegetables, and lean proteins, and low in salt and simple carbohydrates) and exercise (at least 30 minutes of moderate physical activity daily).     The above assessment and management plan was discussed with the patient. The patient verbalized understanding of and has agreed to the management plan.  Patient is aware to call the clinic if they develop any new symptoms or if symptoms persist or worsen. Patient is aware when to return to the clinic for a follow-up visit. Patient educated on when it is appropriate to go to the emergency department.  Return if symptoms worsen or fail to improve.  Harold Ratti [place] Thompson, DNP [facility] [address] [place], [place] [address] [phone]  Note: This document was prepared by [employer] voice dictation technology and any errors that results from this process are unintentional.

## 2024-03-03 ENCOUNTER — Ambulatory Visit: Payer: Self-pay | Admitting: Nurse Practitioner

## 2024-03-03 LAB — ANAEROBIC AND AEROBIC CULTURE

## 2024-03-05 ENCOUNTER — Inpatient Hospital Stay: Admitting: Family Medicine

## 2024-03-23 ENCOUNTER — Ambulatory Visit: Payer: BC Managed Care – PPO | Admitting: Family Medicine

## 2024-03-23 ENCOUNTER — Encounter: Payer: Self-pay | Admitting: Family Medicine

## 2024-08-07 ENCOUNTER — Other Ambulatory Visit: Payer: Self-pay | Admitting: Family Medicine

## 2024-08-07 DIAGNOSIS — T63301A Toxic effect of unspecified spider venom, accidental (unintentional), initial encounter: Secondary | ICD-10-CM

## 2024-08-07 NOTE — Telephone Encounter (Signed)
 Copied from CRM [id number]. Topic: Clinical - Medication Refill >> Aug 07, 2024  9:38 AM Gustabo D wrote: Medication: neomycin-bacitracin-polymyxin [phone] OINT  Has the patient contacted their pharmacy? Yes (Agent: If no, request that the patient contact the pharmacy for the refill. If patient does not wish to contact the pharmacy document the reason why and proceed with request.) (Agent: If yes, when and what did the pharmacy advise?)  This is the patient's preferred pharmacy:  [id number] - [place], [place] - [address] [place] [address] Phone: [phone] Fax: [phone]  Is this the correct pharmacy for this prescription? Yes If no, delete pharmacy and type the correct one.   Has the prescription been filled recently? No  Is the patient out of the medication? Yes  Has the patient been seen for an appointment in the last year OR does the patient have an upcoming appointment? Yes  Can we respond through MyChart? Yes  Agent: Please be advised that Rx refills may take up to 3 business days. We ask that you follow-up with your pharmacy.
